# Patient Record
Sex: Female | Born: 1950
Health system: Southern US, Community
[De-identification: ages and names within clinical notes are randomized; demographics above are authoritative.]

## PROBLEM LIST (undated history)

## (undated) DIAGNOSIS — Z860100 Personal history of colon polyps, unspecified: Secondary | ICD-10-CM

## (undated) DIAGNOSIS — M25511 Pain in right shoulder: Secondary | ICD-10-CM

## (undated) DIAGNOSIS — T7840XA Allergy, unspecified, initial encounter: Secondary | ICD-10-CM

## (undated) DIAGNOSIS — F32A Depression, unspecified: Secondary | ICD-10-CM

## (undated) DIAGNOSIS — C20 Malignant neoplasm of rectum: Secondary | ICD-10-CM

## (undated) DIAGNOSIS — Z8709 Personal history of other diseases of the respiratory system: Secondary | ICD-10-CM

## (undated) DIAGNOSIS — F329 Major depressive disorder, single episode, unspecified: Secondary | ICD-10-CM

## (undated) DIAGNOSIS — R7303 Prediabetes: Secondary | ICD-10-CM

## (undated) DIAGNOSIS — K219 Gastro-esophageal reflux disease without esophagitis: Secondary | ICD-10-CM

## (undated) DIAGNOSIS — M1990 Unspecified osteoarthritis, unspecified site: Secondary | ICD-10-CM

## (undated) DIAGNOSIS — J449 Chronic obstructive pulmonary disease, unspecified: Secondary | ICD-10-CM

## (undated) DIAGNOSIS — R011 Cardiac murmur, unspecified: Secondary | ICD-10-CM

## (undated) DIAGNOSIS — F419 Anxiety disorder, unspecified: Secondary | ICD-10-CM

## (undated) DIAGNOSIS — I10 Essential (primary) hypertension: Secondary | ICD-10-CM

## (undated) DIAGNOSIS — D259 Leiomyoma of uterus, unspecified: Secondary | ICD-10-CM

## (undated) DIAGNOSIS — H269 Unspecified cataract: Secondary | ICD-10-CM

## (undated) HISTORY — DX: Gastro-esophageal reflux disease without esophagitis: K21.9

## (undated) HISTORY — DX: Chronic obstructive pulmonary disease, unspecified: J44.9

## (undated) HISTORY — DX: Major depressive disorder, single episode, unspecified: F32.9

## (undated) HISTORY — DX: Allergy, unspecified, initial encounter: T78.40XA

## (undated) HISTORY — DX: Cardiac murmur, unspecified: R01.1

## (undated) HISTORY — DX: Unspecified osteoarthritis, unspecified site: M19.90

## (undated) HISTORY — DX: Depression, unspecified: F32.A

## (undated) HISTORY — DX: Unspecified cataract: H26.9

## (undated) HISTORY — PX: COLONOSCOPY: SHX174

## (undated) HISTORY — DX: Essential (primary) hypertension: I10

## (undated) HISTORY — DX: Malignant neoplasm of rectum: C20

## (undated) HISTORY — DX: Anxiety disorder, unspecified: F41.9

---
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
## 1998-05-29 ENCOUNTER — Other Ambulatory Visit: Admission: RE | Admit: 1998-05-29 | Discharge: 1998-05-29 | Payer: Self-pay | Admitting: Obstetrics and Gynecology

## 2000-06-30 ENCOUNTER — Other Ambulatory Visit: Admission: RE | Admit: 2000-06-30 | Discharge: 2000-06-30 | Payer: Self-pay | Admitting: Obstetrics and Gynecology

## 2001-07-19 ENCOUNTER — Other Ambulatory Visit: Admission: RE | Admit: 2001-07-19 | Discharge: 2001-07-19 | Payer: Self-pay | Admitting: Obstetrics and Gynecology

## 2002-08-08 ENCOUNTER — Encounter: Admission: RE | Admit: 2002-08-08 | Discharge: 2002-08-08 | Payer: Self-pay | Admitting: Obstetrics and Gynecology

## 2002-08-08 ENCOUNTER — Encounter: Payer: Self-pay | Admitting: Obstetrics and Gynecology

## 2002-09-22 HISTORY — PX: APPENDECTOMY: SHX54

## 2002-09-22 HISTORY — PX: LOW ANTERIOR BOWEL RESECTION: SUR1240

## 2002-11-02 ENCOUNTER — Encounter: Payer: Self-pay | Admitting: Gastroenterology

## 2002-11-02 ENCOUNTER — Ambulatory Visit (HOSPITAL_COMMUNITY): Admission: RE | Admit: 2002-11-02 | Discharge: 2002-11-02 | Payer: Self-pay | Admitting: Gastroenterology

## 2002-11-28 ENCOUNTER — Encounter: Payer: Self-pay | Admitting: General Surgery

## 2002-12-01 ENCOUNTER — Inpatient Hospital Stay (HOSPITAL_COMMUNITY): Admission: RE | Admit: 2002-12-01 | Discharge: 2002-12-10 | Payer: Self-pay | Admitting: General Surgery

## 2002-12-01 ENCOUNTER — Encounter (INDEPENDENT_AMBULATORY_CARE_PROVIDER_SITE_OTHER): Payer: Self-pay | Admitting: Specialist

## 2003-01-10 ENCOUNTER — Ambulatory Visit: Admission: RE | Admit: 2003-01-10 | Discharge: 2003-03-15 | Payer: Self-pay | Admitting: Radiation Oncology

## 2003-09-23 DIAGNOSIS — D259 Leiomyoma of uterus, unspecified: Secondary | ICD-10-CM

## 2003-09-23 HISTORY — DX: Leiomyoma of uterus, unspecified: D25.9

## 2003-11-06 ENCOUNTER — Ambulatory Visit (HOSPITAL_COMMUNITY): Admission: RE | Admit: 2003-11-06 | Discharge: 2003-11-06 | Payer: Self-pay | Admitting: Oncology

## 2004-01-19 ENCOUNTER — Encounter: Admission: RE | Admit: 2004-01-19 | Discharge: 2004-01-19 | Payer: Self-pay | Admitting: General Surgery

## 2004-04-29 ENCOUNTER — Encounter: Admission: RE | Admit: 2004-04-29 | Discharge: 2004-04-29 | Payer: Self-pay | Admitting: Oncology

## 2004-05-06 ENCOUNTER — Encounter: Admission: RE | Admit: 2004-05-06 | Discharge: 2004-05-06 | Payer: Self-pay | Admitting: Oncology

## 2004-10-23 ENCOUNTER — Ambulatory Visit: Payer: Self-pay | Admitting: Gastroenterology

## 2004-10-28 ENCOUNTER — Ambulatory Visit: Payer: Self-pay | Admitting: Oncology

## 2004-11-06 ENCOUNTER — Ambulatory Visit: Payer: Self-pay | Admitting: Gastroenterology

## 2005-04-25 ENCOUNTER — Ambulatory Visit: Payer: Self-pay | Admitting: Oncology

## 2005-04-28 ENCOUNTER — Ambulatory Visit (HOSPITAL_COMMUNITY): Admission: RE | Admit: 2005-04-28 | Discharge: 2005-04-28 | Payer: Self-pay | Admitting: Oncology

## 2005-10-24 ENCOUNTER — Ambulatory Visit: Payer: Self-pay | Admitting: Oncology

## 2006-05-08 ENCOUNTER — Ambulatory Visit: Payer: Self-pay | Admitting: Oncology

## 2006-05-14 LAB — CEA: CEA: 2.7 ng/mL (ref 0.0–5.0)

## 2006-05-14 LAB — COMPREHENSIVE METABOLIC PANEL
ALT: 26 U/L (ref 0–40)
Albumin: 4.7 g/dL (ref 3.5–5.2)
CO2: 28 mEq/L (ref 19–32)
Calcium: 9.4 mg/dL (ref 8.4–10.5)
Chloride: 105 mEq/L (ref 96–112)
Glucose, Bld: 114 mg/dL — ABNORMAL HIGH (ref 70–99)
Potassium: 4.7 mEq/L (ref 3.5–5.3)
Sodium: 141 mEq/L (ref 135–145)
Total Protein: 7.4 g/dL (ref 6.0–8.3)

## 2006-05-14 LAB — CBC WITH DIFFERENTIAL/PLATELET
BASO%: 0.5 % (ref 0.0–2.0)
Eosinophils Absolute: 0.1 10*3/uL (ref 0.0–0.5)
LYMPH%: 27.4 % (ref 14.0–48.0)
MCHC: 33.2 g/dL (ref 32.0–36.0)
MONO#: 0.3 10*3/uL (ref 0.1–0.9)
NEUT#: 3.1 10*3/uL (ref 1.5–6.5)
Platelets: 223 10*3/uL (ref 145–400)
RBC: 4.7 10*6/uL (ref 3.70–5.32)
RDW: 13.2 % (ref 11.3–14.5)
WBC: 4.8 10*3/uL (ref 3.9–10.0)
lymph#: 1.3 10*3/uL (ref 0.9–3.3)

## 2006-05-14 LAB — LACTATE DEHYDROGENASE: LDH: 162 U/L (ref 94–250)

## 2006-11-09 ENCOUNTER — Ambulatory Visit: Payer: Self-pay | Admitting: Oncology

## 2006-11-12 LAB — COMPREHENSIVE METABOLIC PANEL
AST: 28 U/L (ref 0–37)
Albumin: 4.6 g/dL (ref 3.5–5.2)
Alkaline Phosphatase: 84 U/L (ref 39–117)
Chloride: 104 mEq/L (ref 96–112)
Glucose, Bld: 132 mg/dL — ABNORMAL HIGH (ref 70–99)
Potassium: 4.3 mEq/L (ref 3.5–5.3)
Sodium: 142 mEq/L (ref 135–145)
Total Protein: 7.4 g/dL (ref 6.0–8.3)

## 2006-11-12 LAB — CBC WITH DIFFERENTIAL/PLATELET
Basophils Absolute: 0 10*3/uL (ref 0.0–0.1)
EOS%: 2.1 % (ref 0.0–7.0)
HGB: 13 g/dL (ref 11.6–15.9)
MCH: 28.2 pg (ref 26.0–34.0)
MONO#: 0.3 10*3/uL (ref 0.1–0.9)
NEUT#: 3 10*3/uL (ref 1.5–6.5)
RDW: 13.2 % (ref 11.3–14.5)
WBC: 4.5 10*3/uL (ref 3.9–10.0)
lymph#: 1.1 10*3/uL (ref 0.9–3.3)

## 2006-12-02 ENCOUNTER — Encounter: Admission: RE | Admit: 2006-12-02 | Discharge: 2006-12-02 | Payer: Self-pay | Admitting: Oncology

## 2006-12-07 ENCOUNTER — Ambulatory Visit: Payer: Self-pay | Admitting: Gastroenterology

## 2006-12-23 ENCOUNTER — Encounter (INDEPENDENT_AMBULATORY_CARE_PROVIDER_SITE_OTHER): Payer: Self-pay | Admitting: *Deleted

## 2006-12-23 ENCOUNTER — Ambulatory Visit: Payer: Self-pay | Admitting: Gastroenterology

## 2007-01-15 ENCOUNTER — Inpatient Hospital Stay (HOSPITAL_COMMUNITY): Admission: EM | Admit: 2007-01-15 | Discharge: 2007-01-18 | Payer: Self-pay | Admitting: Emergency Medicine

## 2007-02-24 ENCOUNTER — Ambulatory Visit: Payer: Self-pay | Admitting: Critical Care Medicine

## 2007-03-15 ENCOUNTER — Ambulatory Visit: Payer: Self-pay | Admitting: Internal Medicine

## 2007-05-09 ENCOUNTER — Ambulatory Visit: Payer: Self-pay | Admitting: Oncology

## 2007-05-13 LAB — COMPREHENSIVE METABOLIC PANEL
ALT: 21 U/L (ref 0–35)
AST: 21 U/L (ref 0–37)
CO2: 25 mEq/L (ref 19–32)
Calcium: 9.1 mg/dL (ref 8.4–10.5)
Chloride: 106 mEq/L (ref 96–112)
Potassium: 3.9 mEq/L (ref 3.5–5.3)
Sodium: 142 mEq/L (ref 135–145)
Total Protein: 6.9 g/dL (ref 6.0–8.3)

## 2007-05-13 LAB — CBC WITH DIFFERENTIAL/PLATELET
BASO%: 0.6 % (ref 0.0–2.0)
EOS%: 1.4 % (ref 0.0–7.0)
HCT: 36.6 % (ref 34.8–46.6)
MCHC: 33.5 g/dL (ref 32.0–36.0)
MONO#: 0.3 10*3/uL (ref 0.1–0.9)
RBC: 4.35 10*6/uL (ref 3.70–5.32)
RDW: 13.6 % (ref 11.3–14.5)
WBC: 5.2 10*3/uL (ref 3.9–10.0)
lymph#: 1.3 10*3/uL (ref 0.9–3.3)

## 2007-05-13 LAB — LACTATE DEHYDROGENASE: LDH: 143 U/L (ref 94–250)

## 2007-11-10 ENCOUNTER — Ambulatory Visit: Payer: Self-pay | Admitting: Oncology

## 2007-11-15 ENCOUNTER — Ambulatory Visit (HOSPITAL_COMMUNITY): Admission: RE | Admit: 2007-11-15 | Discharge: 2007-11-15 | Payer: Self-pay | Admitting: Oncology

## 2007-11-15 LAB — CBC WITH DIFFERENTIAL/PLATELET
Eosinophils Absolute: 0.1 10*3/uL (ref 0.0–0.5)
HCT: 38.7 % (ref 34.8–46.6)
LYMPH%: 29.2 % (ref 14.0–48.0)
MCV: 84.1 fL (ref 81.0–101.0)
MONO%: 5.9 % (ref 0.0–13.0)
NEUT#: 2.9 10*3/uL (ref 1.5–6.5)
NEUT%: 62.7 % (ref 39.6–76.8)
Platelets: 214 10*3/uL (ref 145–400)
RBC: 4.61 10*6/uL (ref 3.70–5.32)

## 2007-11-15 LAB — COMPREHENSIVE METABOLIC PANEL
AST: 26 U/L (ref 0–37)
Alkaline Phosphatase: 96 U/L (ref 39–117)
BUN: 13 mg/dL (ref 6–23)
Calcium: 9.3 mg/dL (ref 8.4–10.5)
Chloride: 103 mEq/L (ref 96–112)
Creatinine, Ser: 0.68 mg/dL (ref 0.40–1.20)
Glucose, Bld: 117 mg/dL — ABNORMAL HIGH (ref 70–99)

## 2007-11-15 LAB — CEA: CEA: 1.8 ng/mL (ref 0.0–5.0)

## 2008-01-14 ENCOUNTER — Telehealth (INDEPENDENT_AMBULATORY_CARE_PROVIDER_SITE_OTHER): Payer: Self-pay | Admitting: *Deleted

## 2008-05-07 ENCOUNTER — Ambulatory Visit: Payer: Self-pay | Admitting: Oncology

## 2008-06-05 ENCOUNTER — Encounter: Payer: Self-pay | Admitting: Gastroenterology

## 2008-06-05 LAB — CBC WITH DIFFERENTIAL/PLATELET
BASO%: 0.5 % (ref 0.0–2.0)
EOS%: 1.1 % (ref 0.0–7.0)
MCH: 28.2 pg (ref 26.0–34.0)
MCHC: 33.5 g/dL (ref 32.0–36.0)
RBC: 4.57 10*6/uL (ref 3.70–5.32)
RDW: 12.8 % (ref 11.3–14.5)
lymph#: 1.5 10*3/uL (ref 0.9–3.3)

## 2008-06-05 LAB — COMPREHENSIVE METABOLIC PANEL
AST: 19 U/L (ref 0–37)
Albumin: 4.7 g/dL (ref 3.5–5.2)
Alkaline Phosphatase: 99 U/L (ref 39–117)
Potassium: 4.2 mEq/L (ref 3.5–5.3)
Sodium: 140 mEq/L (ref 135–145)
Total Bilirubin: 0.6 mg/dL (ref 0.3–1.2)
Total Protein: 7.2 g/dL (ref 6.0–8.3)

## 2008-11-08 ENCOUNTER — Encounter (INDEPENDENT_AMBULATORY_CARE_PROVIDER_SITE_OTHER): Payer: Self-pay | Admitting: *Deleted

## 2008-11-09 DIAGNOSIS — K219 Gastro-esophageal reflux disease without esophagitis: Secondary | ICD-10-CM

## 2008-11-09 DIAGNOSIS — J209 Acute bronchitis, unspecified: Secondary | ICD-10-CM | POA: Insufficient documentation

## 2008-11-10 ENCOUNTER — Ambulatory Visit: Payer: Self-pay | Admitting: Critical Care Medicine

## 2008-11-10 ENCOUNTER — Encounter: Payer: Self-pay | Admitting: Adult Health

## 2008-11-30 ENCOUNTER — Ambulatory Visit: Payer: Self-pay | Admitting: Oncology

## 2008-12-05 ENCOUNTER — Encounter: Payer: Self-pay | Admitting: Gastroenterology

## 2008-12-05 LAB — CBC WITH DIFFERENTIAL/PLATELET
Basophils Absolute: 0.1 10*3/uL (ref 0.0–0.1)
EOS%: 2.4 % (ref 0.0–7.0)
HCT: 39 % (ref 34.8–46.6)
HGB: 12.8 g/dL (ref 11.6–15.9)
LYMPH%: 22.3 % (ref 14.0–49.7)
MCH: 27.7 pg (ref 25.1–34.0)
MCV: 84.8 fL (ref 79.5–101.0)
MONO%: 5.6 % (ref 0.0–14.0)
NEUT%: 67.8 % (ref 38.4–76.8)
Platelets: 243 10*3/uL (ref 145–400)
RDW: 13.4 % (ref 11.2–14.5)

## 2008-12-05 LAB — COMPREHENSIVE METABOLIC PANEL
AST: 20 U/L (ref 0–37)
Albumin: 4.5 g/dL (ref 3.5–5.2)
Alkaline Phosphatase: 99 U/L (ref 39–117)
BUN: 15 mg/dL (ref 6–23)
Calcium: 9.3 mg/dL (ref 8.4–10.5)
Creatinine, Ser: 0.72 mg/dL (ref 0.40–1.20)
Glucose, Bld: 114 mg/dL — ABNORMAL HIGH (ref 70–99)
Total Bilirubin: 0.4 mg/dL (ref 0.3–1.2)

## 2008-12-05 LAB — LACTATE DEHYDROGENASE: LDH: 192 U/L (ref 94–250)

## 2008-12-05 LAB — CEA: CEA: 2.7 ng/mL (ref 0.0–5.0)

## 2008-12-15 ENCOUNTER — Encounter: Payer: Self-pay | Admitting: Gastroenterology

## 2008-12-18 ENCOUNTER — Ambulatory Visit (HOSPITAL_COMMUNITY): Admission: RE | Admit: 2008-12-18 | Discharge: 2008-12-18 | Payer: Self-pay | Admitting: Oncology

## 2008-12-19 ENCOUNTER — Ambulatory Visit: Payer: Self-pay | Admitting: Gastroenterology

## 2008-12-29 ENCOUNTER — Ambulatory Visit: Payer: Self-pay | Admitting: Gastroenterology

## 2009-06-12 ENCOUNTER — Ambulatory Visit: Payer: Self-pay | Admitting: Oncology

## 2009-06-14 ENCOUNTER — Encounter: Payer: Self-pay | Admitting: Gastroenterology

## 2009-06-14 LAB — CBC WITH DIFFERENTIAL/PLATELET
BASO%: 0.6 % (ref 0.0–2.0)
EOS%: 1.5 % (ref 0.0–7.0)
LYMPH%: 22.9 % (ref 14.0–49.7)
MCHC: 33.2 g/dL (ref 31.5–36.0)
MONO#: 0.4 10*3/uL (ref 0.1–0.9)
Platelets: 218 10*3/uL (ref 145–400)
RBC: 4.47 10*6/uL (ref 3.70–5.45)
WBC: 5.9 10*3/uL (ref 3.9–10.3)
lymph#: 1.3 10*3/uL (ref 0.9–3.3)

## 2009-06-14 LAB — COMPREHENSIVE METABOLIC PANEL
ALT: 23 U/L (ref 0–35)
AST: 28 U/L (ref 0–37)
CO2: 28 mEq/L (ref 19–32)
Sodium: 141 mEq/L (ref 135–145)
Total Bilirubin: 0.7 mg/dL (ref 0.3–1.2)
Total Protein: 7.4 g/dL (ref 6.0–8.3)

## 2009-06-14 LAB — LACTATE DEHYDROGENASE: LDH: 145 U/L (ref 94–250)

## 2009-10-17 ENCOUNTER — Ambulatory Visit: Payer: Self-pay | Admitting: Critical Care Medicine

## 2009-10-17 ENCOUNTER — Inpatient Hospital Stay (HOSPITAL_COMMUNITY): Admission: AD | Admit: 2009-10-17 | Discharge: 2009-10-22 | Payer: Self-pay | Admitting: Critical Care Medicine

## 2009-10-17 DIAGNOSIS — I10 Essential (primary) hypertension: Secondary | ICD-10-CM | POA: Insufficient documentation

## 2009-11-02 ENCOUNTER — Ambulatory Visit: Payer: Self-pay | Admitting: Pulmonary Disease

## 2009-11-02 DIAGNOSIS — J45909 Unspecified asthma, uncomplicated: Secondary | ICD-10-CM | POA: Insufficient documentation

## 2009-12-07 ENCOUNTER — Ambulatory Visit: Payer: Self-pay | Admitting: Oncology

## 2009-12-11 ENCOUNTER — Encounter: Payer: Self-pay | Admitting: Gastroenterology

## 2009-12-11 LAB — LACTATE DEHYDROGENASE: LDH: 167 U/L (ref 94–250)

## 2009-12-11 LAB — CBC WITH DIFFERENTIAL/PLATELET
Eosinophils Absolute: 0.1 10*3/uL (ref 0.0–0.5)
MONO#: 0.3 10*3/uL (ref 0.1–0.9)
MONO%: 4.6 % (ref 0.0–14.0)
NEUT#: 3.8 10*3/uL (ref 1.5–6.5)
RBC: 4.44 10*6/uL (ref 3.70–5.45)
RDW: 14.1 % (ref 11.2–14.5)
WBC: 5.7 10*3/uL (ref 3.9–10.3)

## 2009-12-11 LAB — COMPREHENSIVE METABOLIC PANEL
ALT: 22 U/L (ref 0–35)
AST: 25 U/L (ref 0–37)
CO2: 31 mEq/L (ref 19–32)
Chloride: 108 mEq/L (ref 96–112)
Sodium: 143 mEq/L (ref 135–145)
Total Bilirubin: 0.6 mg/dL (ref 0.3–1.2)
Total Protein: 7.5 g/dL (ref 6.0–8.3)

## 2009-12-11 LAB — CEA: CEA: 4.3 ng/mL (ref 0.0–5.0)

## 2009-12-17 ENCOUNTER — Telehealth (INDEPENDENT_AMBULATORY_CARE_PROVIDER_SITE_OTHER): Payer: Self-pay | Admitting: *Deleted

## 2010-01-17 ENCOUNTER — Telehealth (INDEPENDENT_AMBULATORY_CARE_PROVIDER_SITE_OTHER): Payer: Self-pay | Admitting: *Deleted

## 2010-02-11 ENCOUNTER — Ambulatory Visit: Payer: Self-pay | Admitting: Oncology

## 2010-02-19 ENCOUNTER — Telehealth (INDEPENDENT_AMBULATORY_CARE_PROVIDER_SITE_OTHER): Payer: Self-pay | Admitting: *Deleted

## 2010-03-13 ENCOUNTER — Telehealth: Payer: Self-pay | Admitting: Pulmonary Disease

## 2010-03-21 ENCOUNTER — Telehealth: Payer: Self-pay | Admitting: Pulmonary Disease

## 2010-06-11 ENCOUNTER — Ambulatory Visit: Payer: Self-pay | Admitting: Oncology

## 2010-06-14 ENCOUNTER — Encounter: Payer: Self-pay | Admitting: Gastroenterology

## 2010-06-14 LAB — COMPREHENSIVE METABOLIC PANEL
CO2: 32 mEq/L (ref 19–32)
Creatinine, Ser: 0.7 mg/dL (ref 0.40–1.20)
Glucose, Bld: 76 mg/dL (ref 70–99)
Total Bilirubin: 0.9 mg/dL (ref 0.3–1.2)

## 2010-06-14 LAB — CBC WITH DIFFERENTIAL/PLATELET
Eosinophils Absolute: 0.1 10*3/uL (ref 0.0–0.5)
HCT: 39.4 % (ref 34.8–46.6)
LYMPH%: 31.4 % (ref 14.0–49.7)
MCHC: 32.1 g/dL (ref 31.5–36.0)
MONO#: 0.5 10*3/uL (ref 0.1–0.9)
NEUT%: 58.6 % (ref 38.4–76.8)
Platelets: 226 10*3/uL (ref 145–400)
WBC: 5.6 10*3/uL (ref 3.9–10.3)

## 2010-06-14 LAB — CEA: CEA: 2.3 ng/mL (ref 0.0–5.0)

## 2010-06-14 LAB — LACTATE DEHYDROGENASE: LDH: 143 U/L (ref 94–250)

## 2010-08-21 ENCOUNTER — Encounter: Admission: RE | Admit: 2010-08-21 | Discharge: 2010-08-21 | Payer: Self-pay | Admitting: Oncology

## 2010-09-02 ENCOUNTER — Telehealth: Payer: Self-pay | Admitting: Pulmonary Disease

## 2010-09-11 ENCOUNTER — Ambulatory Visit: Payer: Self-pay | Admitting: Pulmonary Disease

## 2010-09-12 ENCOUNTER — Telehealth: Payer: Self-pay | Admitting: Pulmonary Disease

## 2010-10-02 ENCOUNTER — Ambulatory Visit: Admit: 2010-10-02 | Payer: Self-pay | Admitting: Pulmonary Disease

## 2010-10-03 ENCOUNTER — Telehealth: Payer: Self-pay | Admitting: Pulmonary Disease

## 2010-10-12 ENCOUNTER — Encounter: Payer: Self-pay | Admitting: Obstetrics and Gynecology

## 2010-10-13 ENCOUNTER — Encounter (HOSPITAL_COMMUNITY): Payer: Self-pay | Admitting: Oncology

## 2010-10-22 NOTE — Letter (Signed)
Summary: [employer]  [address]   [place], [place] [address]   Phone: [phone]  Fax: [phone]    Today's Date: November 02, 2009  Name of Patient: Jane [name]  The above named patient had a medical visit today at:  2:15 pm.  Please take this into consideration when reviewing the time away from work.    Special Instructions:  [  ] None  [  ] To be off the remainder of today, returning to the normal work / school schedule tomorrow.  [  ] To be off until the next scheduled appointment on ______________________.  [x ] Other ______The above named patient is released to return to work on 11/05/2009 if you have any questions or concerns please give [place] a call                                          Sincerely yours,    Dr. Coralyn Helling

## 2010-10-22 NOTE — Progress Notes (Signed)
Summary: nos appt  Phone Note Call from Patient   Caller: [email or web address]  Call For: Jane [name] Summary of Call: ATC x2 to rsc nos from 6/29, mailbox full. Initial call taken by: Darletta Moll,  March 21, 2010 3:44 PM

## 2010-10-22 NOTE — Letter (Signed)
[facility]   Imported By: Lennie Odor 01/02/2010 15:17:08  _____________________________________________________________________  External Attachment:    Type:   Image     Comment:   External Document

## 2010-10-22 NOTE — Progress Notes (Signed)
Summary: WORK NOTE  Phone Note Call from Patient Call back at Home Phone [phone]   Caller: Patient Call For: SOOD Summary of Call: PT IS WANTING A NOTE FOR WORK DUE TO THEM PAINTING INSIDE THE BUILDING DUE TO THE FUMES AND IT AFFECTING HER ASTHMA  Initial call taken by: Lacinda Axon,  Feb 19, 2010 12:04 PM  Follow-up for Phone Call        [facility]. Carron Curie CMA  Feb 19, 2010 12:13 PM  Spoke with pt and she states her building was painted end of last week adn they used industrial paint and the fumes were very strong and it caused her to have asthma problems and she was not able to go to work on Friday. Pt states the building was aired out over teh weekend and the fumes are gone and she is better. She is requesting a work excuse note for Friday May 27,2011.  Pt request note be faxed to her job, her cell phone lost reception so I did not get fax number [facility] and get #. Please advise. Carron Curie CMA  Feb 19, 2010 12:18 PM   pt says fax # is [phone].  She wants you to call her before you fax as she may be able to come by and pick up. Follow-up by: Eugene Gavia,  Feb 19, 2010 1:07 PM  Additional Follow-up for Phone Call Additional follow up Details #1::        Will forward message to VS to address if ok to give pt a work note.  Please advise.  Thanks.  Arman Filter LPN  Feb 19, 2010 2:04 PM     Additional Follow-up for Phone Call Additional follow up Details #2::    Please advise the patient that we can not provide a work note if she was not evaluated by our practice at the time of her illness.  She may need to discuss with her human resource department to determine if her workplace has a policy to address this type of situation. Follow-up by: Coralyn Helling MD,  Feb 19, 2010 3:02 PM   Appended Document: WORK NOTE pt states she just needs a note stating her diagnosis of asthma and that is all her human resource dept needs, she states it does not have to be an out of  work note for Friday. Just a note that staets she has asthma. Please advise.   Appended Document: WORK NOTE I have dictated a letter, and will have triage/my nurse notify the patient when letter has been transcribed.

## 2010-10-22 NOTE — Progress Notes (Signed)
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 pt requests sample of advair (broke-in-between paychecks). [phone] Initial call taken by: Tivis Ringer, CNA,  March 13, 2010 2:14 PM  Follow-up for Phone Call        sample of the adviar 500/50 has been left up front for her.  she did schedule an appt on wednesday  6-29 at 12 to see VS for this problem.  she will come by to pick up the sample and the note written by VS Randell Loop CMA  March 13, 2010 3:08 PM

## 2010-10-22 NOTE — Progress Notes (Signed)
Summary: rash  Phone Note Call from Patient Call back at Work Phone [phone]   Caller: Patient Call For: sood Summary of Call: pt states that she has a rash on the inside of her hands as well as on her back, and behind knees x 1 1/2 wks. thinks this is due to the "increase" of usage of either spiriva or advair. (says dr sood increased these at [facility] 2/11. rash is "little bumps" not oozing or anything but very itchy. pt hasn't used OTC for relief. please advise. work #. note: pt also wants to know if soiriva "weakens the immune system". Initial call taken by: Tivis Ringer, CNA,  December 17, 2009 9:39 AM  Follow-up for Phone Call        Pt c/o while she was inthe hospital and using spiriva she developed a rash on most of her body. She states the rash has come back and is worse since she has continued spiriva and started advair 500-50. Pt states the rash is on her hands, back, and back of her knees. She states it is rashed red bumps and it itches. She has decreased use of advair to once daily, and states she does not use spiriva on reg basis, but still has rash.  She also wants to know if spiriva could be effecting her immune system. Pelase advise. Carron Curie CMA  December 17, 2009 10:32 AM   Additional Follow-up for Phone Call Additional follow up Details #1::        Plan was to stop spiriva anyway.  Have her stop spiriva, and monitor rash.  If rash persists, then have her stop advair also.  She is to continue with her nebulizer treatment as previously prescribed.  Please have her call again if her breathing gets worse after stopping her inhalers. Additional Follow-up by: Coralyn Helling MD,  December 17, 2009 1:29 PM

## 2010-10-22 NOTE — Assessment & Plan Note (Signed)
Summary: hfu/jd   Primary Provider/Referring Provider:  Leonides Sake  CC:  HFU.  Asthma.  The patient states her breathing has improved on Spiriva. She is no longer using the Advair and says she stopped taking Melatonin at night  because it did not help.Jane [name]  History of Present Illness: 60  year old  female who has had chronic cough , and asthmatic bronchitis  She has been doing well since hospital discharge.  She is not having much cough, wheeze, or sputum.  She is not using advair.  Some one in the hospital told her she did not need to use advair with prednisone.  She is using her xopenex two times a day.  She continues with spiriva.   Current Medications (verified): 1)  Multivitamins  Tabs (Multiple Vitamin) .... Take 1 Tablet By Mouth Once A Day 2)  Paxil 30 Mg Tabs (Paroxetine Hcl) .... Take 1 Tablet By Mouth Once A Day 3)  Amlodipine Besylate 10 Mg Tabs (Amlodipine Besylate) .... Take 1 Tablet By Mouth Once A Day 4)  Xopenex Hfa 45 Mcg/act Aero (Levalbuterol Tartrate) .... Inhale 2 Puffs Every Four Hours As Needed 5)  Spiriva Handihaler 18 Mcg Caps (Tiotropium Bromide Monohydrate) .Jane [name].. 1 Capsule in Handihaler Daily  Allergies (verified): 1)  ! Morphine  Past History:  Past Medical History: colon cancer 2004- Dr. Craige Cotta , resection 2004, last colon 2010 -no reoccurence Asthma Hypertension Depression GERD  Past Surgical History: Reviewed history from 10/17/2009 and no changes required. colon cancer 2004-s/p resection  Family History: Reviewed history from 10/17/2009 and no changes required. pos for colon ca-aunt  mother-? coronary artery disease grandmother -myeloma  Social History: Reviewed history from 10/17/2009 and no changes required. Divorced 2 kids- adult  Never smoker Rare alcohol no drug hx   Vital Signs:  Patient profile:   60 year old female Height:      62 inches (157.48 cm) Weight:      196.50 pounds (89.32 kg) BMI:     36.07 O2 Sat:      97 % on  Room air Temp:     98.9 degrees F (37.17 degrees C) oral Pulse rate:   128 / minute BP sitting:   130 / 70  (left arm) Cuff size:   regular  Vitals Entered By: Michel Bickers CMA (November 02, 2009 2:36 PM)  O2 Sat at Rest %:  97 O2 Flow:  Room air  Physical Exam  General:  healthy appearing and obese.   Nose:  no deformity, discharge, inflammation, or lesions Mouth:  no deformity or lesions Neck:  no JVD.   Lungs:  clear bilaterally to auscultation and percussion Heart:  regular rate and rhythm, S1, S2 without murmurs, rubs, gallops, or clicks Abdomen:  bowel sounds positive; abdomen soft and non-tender without masses, or organomegaly Extremities:  no clubbing, cyanosis, edema, or deformity noted Cervical Nodes:  no significant adenopathy   Impression & Recommendations:  Problem # 1:  ASTHMA (ICD-493.90) She has been doing well since hospital discharge.  She was confused about whether she needs to continue advair.  I will restart her advair.  If she is stable after several weeks, then I advised that she can stop the spiriva and monitor her symptoms.  Her PFT's in hospital showed severe obstruction, but this was done during an exacerbation.  Will repeat spirometry at next follow up to see if she has improvement in her numbers.  Medications Added to Medication List This Visit: 1)  Spiriva  Handihaler 18 Mcg Caps (Tiotropium bromide monohydrate) .Jane [name].. 1 capsule in handihaler daily 2)  Advair Diskus 500-50 Mcg/dose Aepb (Fluticasone-salmeterol) .... One puff two times a day  Complete Medication List: 1)  Multivitamins Tabs (Multiple vitamin) .... Take 1 tablet by mouth once a day 2)  Paxil 30 Mg Tabs (Paroxetine hcl) .... Take 1 tablet by mouth once a day 3)  Amlodipine Besylate 10 Mg Tabs (Amlodipine besylate) .... Take 1 tablet by mouth once a day 4)  Xopenex Hfa 45 Mcg/act Aero (Levalbuterol tartrate) .... Inhale 2 puffs every four hours as needed 5)  Spiriva Handihaler 18 Mcg Caps  (Tiotropium bromide monohydrate) .Jane [name].. 1 capsule in handihaler daily 6)  Advair Diskus 500-50 Mcg/dose Aepb (Fluticasone-salmeterol) .... One puff two times a day  Other Orders: Est. Patient Level III ([address])  Patient Instructions: 1)  Advair one puff two times a day 2)  Continue spiriva for next 3 or 4 weeks.  If breathing is doing well, then stop spiriva and continue advair. 3)  Continue xopenex two puffs up to four times per day as needed for cough, wheeze, congestion, or shortness of breath 4)  Follow up in 3 months Prescriptions: ADVAIR DISKUS 500-50 MCG/DOSE AEPB (FLUTICASONE-SALMETEROL) one puff two times a day  #1 x 6   Entered and Authorized by:   Coralyn Helling MD   Signed by:   Coralyn Helling MD on 11/02/2009   Method used:   Electronically to        [facility] [phone]* (retail)       [address]       [place], [place]  [address]       Ph: [phone] or [phone]       Fax: [phone]   RxID:   [id number]

## 2010-10-22 NOTE — Progress Notes (Signed)
Summary: breathing problem  Phone Note Call from Patient Call back at [phone]   Caller: Patient Call For: sood Summary of Call: pt still have breathing problem after stopping spiriva and advair Initial call taken by: Rickard Patience,  January 17, 2010 1:48 PM  Follow-up for Phone Call        Pt states she has stopped spiriva, but has continued advair one puff at night because she is afraid her breathing will get worse if she stops. She states she is still itching, but this is controlled by benadryl. SHe wants to [facility] if it is ok to take benadryl daily? She also states she read that advair can effect your wbc count and she wanted to know if this was true? Please advise.Carron Curie CMA  January 17, 2010 2:22 PM   Additional Follow-up for Phone Call Additional follow up Details #1::        Explain to her that elevated WBC level is more common with systemic steroid use, such as prednisone, and not as much of an issue with inhaled steroids.  She can continue advair, and as needed xopenex.  She would need to f/u with her primary physician to better determine what is causing her itching, since this may not be related to her inhaler use. Additional Follow-up by: Coralyn Helling MD,  January 17, 2010 3:11 PM     Appended Document: breathing problem pt advised.

## 2010-10-22 NOTE — Assessment & Plan Note (Signed)
Summary: HOSPITAL ADMISSION   Primary Provider/Referring Provider:  Leonides Sake  CC:  pt walked in: increased SOB, chest tightness, and prod cough with clear  mucus x2days.  History of Present Illness: HOSPITAL ADMISSION  60  year old  female who has had chronic cough , and asthmatic bronchitis  October 17, 2009--Last seen 10/2008. Presents for an acute office visit. pt walked in: Complains over last 1 week she has been very sick with cough, congesiton, increased SOB. Has not slept due to cough. She  feels terrible weak. She is in wheelchair due to feels lightheaded walking. She has been wheezing so bad it is hard to catch breath. She has had decreased appetite. On arrival she had marginal O2 sats 90-92%-we verified few times (2/2 artificial nails). HR is 140s. She denies chest pain. Has used xopenex HFA 1x this am 1 hr prior to arrrival at 8am. Denies chest pain, orthopnea, hemoptysis,  n/v/d, edema, headache,recent travel or antibiotics.   Medications Prior to Update: 1)  Advair Diskus 250-50 Mcg/dose  Misc (Fluticasone-Salmeterol) .... Inhale 1 Puff Two Times A Day, Rinse Mouth Well After Use. 2)  Multivitamins  Tabs (Multiple Vitamin) .... Take 1 Tablet By Mouth Once A Day 3)  Paxil 30 Mg Tabs (Paroxetine Hcl) .... Take 1 Tablet By Mouth Once A Day 4)  Amlodipine Besylate 10 Mg Tabs (Amlodipine Besylate) .... Take 1 Tablet By Mouth Once A Day 5)  Xopenex Hfa 45 Mcg/act Aero (Levalbuterol Tartrate) .... Inhale 2 Puffs Every Four Hours As Needed 6)  Augmentin 875-125 Mg Tabs (Amoxicillin-Pot Clavulanate) .Marland Kitchen.. 1 By Mouth Two Times A Day 7)  Tessalon 200 Mg Caps (Benzonatate) .Marland Kitchen.. 1 By Mouth Three Times A Day As Needed 8)  Promethazine-Codeine 6.25-10 Mg/51ml Syrp (Promethazine-Codeine) .Marland Kitchen.. 1-2 Tsp Every 4-6 Hr As Needed Cough 9)  Prednisone 10 Mg Tabs (Prednisone) .... 4 Tabs For 2 Days, Then 3 Tabs For 2 Days, 2 Tabs For 2 Days, Then 1 Tab For 2 Days, Then Stop 10)  Moviprep 100 Gm  Solr  (Peg-Kcl-Nacl-Nasulf-Na Asc-C) .... Take As Directed With Prep Instructions  Current Medications (verified): 1)  Advair Diskus 250-50 Mcg/dose  Misc (Fluticasone-Salmeterol) .... Inhale 1 Puff Two Times A Day, Rinse Mouth Well After Use. 2)  Multivitamins  Tabs (Multiple Vitamin) .... Take 1 Tablet By Mouth Once A Day 3)  Paxil 30 Mg Tabs (Paroxetine Hcl) .... Take 1 Tablet By Mouth Once A Day 4)  Amlodipine Besylate 10 Mg Tabs (Amlodipine Besylate) .... Take 1 Tablet By Mouth Once A Day 5)  Xopenex Hfa 45 Mcg/act Aero (Levalbuterol Tartrate) .... Inhale 2 Puffs Every Four Hours As Needed  Allergies (verified): 1)  ! Morphine  Past History:  Family History: Last updated: 10/17/2009 pos for colon ca-aunt  mother-? coronary artery disease grandmother -myeloma  Social History: Last updated: 10/17/2009 Divorced 2 kids- adult  Never smoker Rare alcohol no drug hx   Risk Factors: Smoking Status: never (11/09/2008)  Past Medical History: EsoPHAGEAL REFLUX (ICD-530.81) ASTHMATIC BRONCHITIS, ACUTE (ICD-466.0) colon cancer 2004- Dr. Craige Cotta , resection 2004, last colon 2010 -no reoccurence HTN    Past Surgical History: colon cancer 2004-s/p resection  Family History: pos for colon ca-aunt  mother-? coronary artery disease grandmother -myeloma  Social History: Divorced 2 kids- adult  Never smoker Rare alcohol no drug hx   Review of Systems      See HPI  Vital Signs:  Patient profile:   60 year old female Height:  62 inches Weight:      200.25 pounds BMI:     36.76 O2 Sat:      98 % on Room air Temp:     100.2 degrees F oral Pulse rate:   138 / minute BP sitting:   158 / 84  (right arm) Cuff size:   large  Vitals Entered By: Boone Master CNA (October 17, 2009 9:02 AM)  O2 Flow:  Room air CC: pt walked in: increased SOB, chest tightness, prod cough with clear  mucus x2days Is Patient Diabetic? No Comments Medications reviewed with patient Daytime  contact number verified with patient. Boone Master CNA  October 17, 2009 9:03 AM    Physical Exam  Additional Exam:  GEN: A/Ox3; appears weak in wheelchair.  HEENT:  Melvin/AT, , EACs-clear, TMs-wnl, NOSE-clear, THROAT-clear NECK:  Supple w/ fair ROM; no JVD; normal carotid impulses w/o bruits; no thyromegaly or nodules palpated; no lymphadenopathy. CHEST: Coarse BS w/ exp wheezing., Mild accessory use.  HEART:  RRR, no m/r/g  , ST HR 140 ABDOMEN:  Soft & nt; nml bowel sounds; no organomegaly or masses detected. EXT: Warm bil,  no calf pain, edema, clubbing, pulses intact Neuro: intact w/ no focal deficits noted.    Impression & Recommendations:  Problem # 1:  ASTHMATIC BRONCHITIS, ACUTE (ICD-466.0)  Exacerbation w/ associated tacycardia -possible secondary to xopenex use will check labs w/ tsh and cardiac enzymes, EKG Will admit begin pulmonary hygiene w/ nebs, IV steroids, empiric abx w/ Avelox.      Orders: No Charge Patient Arrived (NCPA0) (NCPA0)  Problem # 2:  HYPERTENSION (ICD-401.9) cont on amolodipine.  monitor closely, may need to add bisoprolol for control and ST (would avoid non specifice beta blocker).  Her updated medication list for this problem includes:    Amlodipine Besylate 10 Mg Tabs (Amlodipine besylate) .Marland Kitchen... Take 1 tablet by mouth once a day  Complete Medication List: 1)  Advair Diskus 250-50 Mcg/dose Misc (Fluticasone-salmeterol) .... Inhale 1 puff two times a day, rinse mouth well after use. 2)  Multivitamins Tabs (Multiple vitamin) .... Take 1 tablet by mouth once a day 3)  Paxil 30 Mg Tabs (Paroxetine hcl) .... Take 1 tablet by mouth once a day 4)  Amlodipine Besylate 10 Mg Tabs (Amlodipine besylate) .... Take 1 tablet by mouth once a day 5)  Xopenex Hfa 45 Mcg/act Aero (Levalbuterol tartrate) .... Inhale 2 puffs every four hours as needed  Patient Instructions: 1)  ADMIT TO HOSPITAL   Appended Document: neb documentation     Clinical Lists  Changes  Orders: Added new Service order of Nebulizer Tx ([address]) - Signed

## 2010-10-22 NOTE — Letter (Signed)
Summary: [facility]  [facility]   Imported By: Sherian Rein 07/02/2010 10:52:43  _____________________________________________________________________  External Attachment:    Type:   Image     Comment:   External Document

## 2010-10-24 NOTE — Progress Notes (Signed)
Summary: sample  Phone Note Call from Patient Call back at Home Phone [phone]   Caller: Patient Call For: sood Reason for Call: Talk to Nurse Summary of Call: Patient requesting sample of advair. Initial call taken by: Lehman Prom,  September 02, 2010 9:19 AM  Follow-up for Phone Call        Pt verified Advair 500/50, one sample left at front desk, follow up scheduled for 09/11/10 @ 4:15pm Zackery Barefoot CMA  September 02, 2010 10:51 AM

## 2010-10-24 NOTE — Progress Notes (Signed)
Summary: nos appt  Phone Note Call from Patient   Caller: [email or web address]  Call For: sood Summary of Call: Rsc nos from 12/21 to 1/11. Initial call taken by: Darletta Moll,  September 12, 2010 9:27 AM

## 2010-10-24 NOTE — Progress Notes (Signed)
Summary: nos appt  Phone Note Call from Patient   Caller: [email or web address]  Call For: Stephine Langbehn Summary of Call: LMTCB x2 to rsc nos from 1/11. Initial call taken by: Darletta Moll,  October 03, 2010 3:41 PM

## 2010-12-09 LAB — CBC
HCT: 37.5 % (ref 36.0–46.0)
HCT: 39 % (ref 36.0–46.0)
Hemoglobin: 13.2 g/dL (ref 12.0–15.0)
MCV: 87 fL (ref 78.0–100.0)
Platelets: 216 10*3/uL (ref 150–400)
Platelets: 220 10*3/uL (ref 150–400)
RBC: 4.58 MIL/uL (ref 3.87–5.11)
RDW: 13.4 % (ref 11.5–15.5)
RDW: 13.4 % (ref 11.5–15.5)
WBC: 10.9 10*3/uL — ABNORMAL HIGH (ref 4.0–10.5)

## 2010-12-09 LAB — URINALYSIS, MICROSCOPIC ONLY
Ketones, ur: NEGATIVE mg/dL
Leukocytes, UA: NEGATIVE
Nitrite: NEGATIVE
pH: 5.5 (ref 5.0–8.0)

## 2010-12-09 LAB — DIFFERENTIAL
Basophils Absolute: 0 10*3/uL (ref 0.0–0.1)
Basophils Relative: 0 % (ref 0–1)
Eosinophils Absolute: 0.1 10*3/uL (ref 0.0–0.7)
Neutro Abs: 11.1 10*3/uL — ABNORMAL HIGH (ref 1.7–7.7)
Neutrophils Relative %: 92 % — ABNORMAL HIGH (ref 43–77)

## 2010-12-09 LAB — CARDIAC PANEL(CRET KIN+CKTOT+MB+TROPI)
CK, MB: 5.3 ng/mL — ABNORMAL HIGH (ref 0.3–4.0)
Relative Index: 1.1 (ref 0.0–2.5)
Relative Index: 1.4 (ref 0.0–2.5)
Total CK: 391 U/L — ABNORMAL HIGH (ref 7–177)
Total CK: 394 U/L — ABNORMAL HIGH (ref 7–177)
Troponin I: 0.03 ng/mL (ref 0.00–0.06)

## 2010-12-09 LAB — COMPREHENSIVE METABOLIC PANEL
ALT: 23 U/L (ref 0–35)
Alkaline Phosphatase: 96 U/L (ref 39–117)
BUN: 14 mg/dL (ref 6–23)
CO2: 26 mEq/L (ref 19–32)
Chloride: 101 mEq/L (ref 96–112)
GFR calc non Af Amer: >60 mL/min (ref >60)
Glucose, Bld: 120 mg/dL — ABNORMAL HIGH (ref 70–99)
Potassium: 4.8 mEq/L (ref 3.5–5.1)
Total Bilirubin: 1.4 mg/dL — ABNORMAL HIGH (ref 0.3–1.2)
Total Protein: 7.5 g/dL (ref 6.0–8.3)

## 2010-12-09 LAB — BASIC METABOLIC PANEL
BUN: 14 mg/dL (ref 6–23)
BUN: 21 mg/dL (ref 6–23)
Chloride: 100 mEq/L (ref 96–112)
Creatinine, Ser: 0.78 mg/dL (ref 0.4–1.2)
GFR calc non Af Amer: 59 mL/min — ABNORMAL LOW (ref >60)
GFR calc non Af Amer: >60 mL/min (ref >60)
Glucose, Bld: 272 mg/dL — ABNORMAL HIGH (ref 70–99)
Potassium: 3.6 mEq/L (ref 3.5–5.1)

## 2010-12-09 LAB — BRAIN NATRIURETIC PEPTIDE
Pro B Natriuretic peptide (BNP): 34 pg/mL (ref 0.0–100.0)
Pro B Natriuretic peptide (BNP): <30 pg/mL (ref 0.0–100.0)

## 2010-12-09 LAB — BLOOD GAS, ARTERIAL
Acid-base deficit: 0.4 mmol/L (ref 0.0–2.0)
Bicarbonate: 24 mEq/L (ref 20.0–24.0)
O2 Saturation: 96.8 %
TCO2: 25.3 mmol/L (ref 0–100)
pO2, Arterial: 89.7 mmHg (ref 80.0–100.0)

## 2010-12-09 LAB — MAGNESIUM: Magnesium: 2.1 mg/dL (ref 1.5–2.5)

## 2010-12-09 LAB — URINE CULTURE

## 2010-12-20 ENCOUNTER — Telehealth: Payer: Self-pay | Admitting: Pulmonary Disease

## 2010-12-20 ENCOUNTER — Other Ambulatory Visit: Payer: Self-pay | Admitting: Pulmonary Disease

## 2010-12-20 MED ORDER — LEVALBUTEROL TARTRATE 45 MCG/ACT IN AERO: 2.0000 | INHALATION_SPRAY | RESPIRATORY_TRACT | Status: DC | PRN

## 2010-12-20 MED ORDER — FLUTICASONE-SALMETEROL 500-50 MCG/DOSE IN AEPB: 1.0000 | INHALATION_SPRAY | Freq: Two times a day (BID) | RESPIRATORY_TRACT | Status: DC

## 2010-12-20 NOTE — Telephone Encounter (Signed)
Pt last seen 11-02-09 and has no pending appts.  LMOMTCB.

## 2010-12-20 NOTE — Telephone Encounter (Signed)
Pt is scheduled for follow-up on 01/14/2011 w/ VS. I have left 2 Adair 500/50 samples for her to pick up and sent RX for 1 month of Advair and 1 Xopenex HFA inhaler until OV. Pt aware she must keep OV for any further refill.

## 2010-12-20 NOTE — Telephone Encounter (Signed)
See phone note 12/20/10 lori sent rx until pt ov nxt month

## 2011-01-10 ENCOUNTER — Encounter: Payer: Self-pay | Admitting: Pulmonary Disease

## 2011-01-14 ENCOUNTER — Encounter: Payer: Self-pay | Admitting: Pulmonary Disease

## 2011-01-14 ENCOUNTER — Ambulatory Visit (INDEPENDENT_AMBULATORY_CARE_PROVIDER_SITE_OTHER): Payer: BC Managed Care – PPO | Admitting: Pulmonary Disease

## 2011-01-14 VITALS — BP 124/78 | HR 99 | Temp 98.2°F | Ht 62.0 in | Wt 189.2 lb

## 2011-01-14 DIAGNOSIS — J309 Allergic rhinitis, unspecified: Secondary | ICD-10-CM | POA: Insufficient documentation

## 2011-01-14 DIAGNOSIS — J45909 Unspecified asthma, uncomplicated: Secondary | ICD-10-CM

## 2011-01-14 MED ORDER — LEVALBUTEROL TARTRATE 45 MCG/ACT IN AERO: INHALATION_SPRAY | RESPIRATORY_TRACT | Status: DC

## 2011-01-14 MED ORDER — FLUTICASONE-SALMETEROL 250-50 MCG/DOSE IN AEPB: INHALATION_SPRAY | RESPIRATORY_TRACT | Status: DC

## 2011-01-14 MED ORDER — CETIRIZINE HCL 10 MG PO TABS: ORAL_TABLET | ORAL | Status: DC

## 2011-01-14 NOTE — Patient Instructions (Signed)
Advair 250/50 one puff twice per day, and rinse mouth after using Xopenex two puffs as needed for cough, wheeze, chest congestion, or shortness of breath Zyrtec or equivalent 10 mg daily as needed for allergies Follow up in 6 months

## 2011-01-14 NOTE — Assessment & Plan Note (Signed)
She can use zyrtec or equivalent as needed.

## 2011-01-14 NOTE — Progress Notes (Signed)
  Subjective:    Patient ID: Jane [name], female    DOB: 01-Dec-1950, 60 y.o.   MRN: [id number]  HPI 60 year old female who has had chronic cough , and asthmatic bronchitis  I last saw Ms. Dull in February 2011.  She has been doing well until the past few weeks.  She has noticed more problems with allergies, and this has caused some tightness in her chest.  She ran out of her inhalers a few weeks ago, and has noticed a difference.  Spring is typically her worst season.  She was able to stop spiriva.  She has some nasal congestion.  She has occasional dry cough.  She denies fever, wheeze, or hemoptysis.   Review of Systems     Objective:   Physical Exam Filed Vitals:   01/14/11 1200 01/14/11 1203  BP:  124/78  Pulse:  99  Temp: 98.2 F (36.8 C)   TempSrc: Oral   Height: 5\' 2"  (1.575 m)   Weight: 189 lb 3.2 oz (85.821 kg)   SpO2:  99%   General - Obese, healthy, no distress HEENT - No sinus tenderness, clear drainage, MP 3, no oral lesions, no LAN Cardiac - S1S2 regular, no murmur Chest - no wheeze/rales Abd - soft, non-tender, normal bowel sounds Ext - no edema Neuro - normal strength, A&O x 3, CN intact Psych - normal behavior and mood     Spirometry today>>FEV1 1.50(77%), FEV1% 74  Assessment & Plan:   ASTHMA She has been stable until recently.  She has worsening symptoms with allergens and since she ran out of her inhaler.  Will resume advair but at 250 strength.  Have given samples for these.  She is to continue as needed xopenex.  Allergic rhinitis She can use zyrtec or equivalent as needed.    Updated Medication List Outpatient Encounter Prescriptions as of 01/14/2011  Medication Sig Dispense Refill  . amitriptyline (ELAVIL) 10 MG tablet Take 10 mg by mouth at bedtime.        Marland Kitchen atorvastatin (LIPITOR) 10 MG tablet Take 10 mg by mouth daily.        Marland Kitchen levalbuterol (XOPENEX HFA) 45 MCG/ACT inhaler Two puffs up to four times per day as needed  1 Inhaler  11  .  PARoxetine (PAXIL) 10 MG tablet Take 10 mg by mouth every morning.        Marland Kitchen DISCONTD: Fluticasone-Salmeterol (ADVAIR DISKUS) 500-50 MCG/DOSE AEPB Inhale 1 puff into the lungs 2 (two) times daily.  60 each  0  . DISCONTD: levalbuterol (XOPENEX HFA) 45 MCG/ACT inhaler Inhale 2 puffs into the lungs every 4 (four) hours as needed for wheezing.  1 Inhaler  0  . cetirizine (ZYRTEC) 10 MG tablet One daily as needed for allergies  30 tablet  11  . Fluticasone-Salmeterol (ADVAIR) 250-50 MCG/DOSE AEPB One puff twice per day  60 each  11

## 2011-01-14 NOTE — Assessment & Plan Note (Signed)
She has been stable until recently.  She has worsening symptoms with allergens and since she ran out of her inhaler.  Will resume advair but at 250 strength.  Have given samples for these.  She is to continue as needed xopenex.

## 2011-02-04 NOTE — Assessment & Plan Note (Signed)
[facility]                             PULMONARY OFFICE NOTE   NAME:Jane [name], Jane [name]                     MRN:          [id number]  DATE:02/24/2007                            DOB:          01-05-1951    CHIEF COMPLAINT:  Evaluate cough.   HISTORY OF PRESENT ILLNESS:  A 60 year old female who has had chronic  cough for three months. She initially went to the emergency room in  April for this and treated with nebulizer treatments and diagnosed with  an asthmatic bronchitic flare. She was then hospitalized for 5 days. She  apparently gives a history of having had asthma as a child, but grew out  of this and then became worse in 2007 and worse again this year. She has  been on Advair 250/50 one spray b.i.d., but it causes throat irritation.  The cough is mainly nocturnal. It is productive of clear mucus,  occasionally yellow. She is a lifelong never smoker. She does note acid  reflux symptoms that are worse at night. Note, she has never been  treated for reflux. There is no history of weight change, loss of  appetite and no history of difficulty swallowing or sore throat,  headaches, nasal congestion, sneezing, itching or earache. The patient  is referred for further evaluation.   PAST MEDICAL HISTORY:   MEDICAL:  1. History of hypertension.  2. History of colon cancer in 2004, with resection.  3. History of tubal ligation.  4. Appendectomy.   MEDICATION ALLERGIES:  None.   CURRENT MEDICATIONS:  1. Mobic 15 mg daily.  2. Multivitamin every day.  3. Paxil 30 mg daily.  4. Fish oil daily.  5. Amlodipine 10 mg daily.  6. Advair one spray b.i.d. 250/50 strength.  7. Xopenex HFR p.r.n.   SOCIAL HISTORY:  Lifelong never smoker. Lives with her children. Works  in an office like environment.   FAMILY HISTORY:  Positive for asthma in her mother. Cancer in a paternal  grandmother.   PHYSICAL EXAMINATION:  Temperature 97.9, blood pressure 110/78,  pulse  98, saturation is 99% on room air.  CHEST: Showed distant breath sounds with prolonged expiratory phase.  Occasional wheezes.  CARDIAC: Showed a regular rate and rhythm without S3. Normal S1, S2.  ABDOMEN: Soft, nontender.  EXTREMITIES: Showed no edema or clubbing or venous disease.  SKIN: Was clear.  NEUROLOGIC: Was intact.  HEENT: Showed no jugular venous distention. No lymphadenopathy.  Oropharynx was clear.  NECK: Supple.   LABORATORY DATA:  Pulmonary functions are obtained showing an FEV1 of  51% predicted, FVC of 56% predicted, FEF 25/75 at 33% predicted. Chest x-  ray showed bronchial wall thickening.   IMPRESSION:  Is that of asthmatic bronchitis with significant reflux  disease, increased lower airway inflammation.   RECOMMENDATIONS:  Protonix 40 mg daily. Reflux diet instruction was  given. Continue Advair as is and discontinue further fish oil. Will see  the patient back in return visit in one month.     Charlcie Cradle Delford Field, MD, [facility]  Electronically Signed    PEW/MedQ  DD: 02/25/2007  DT: 02/25/2007  Job #: [id number]   cc:   Samul Dada, M.D.  Holley Bouche, M.D.

## 2011-02-04 NOTE — Assessment & Plan Note (Signed)
[facility]                             PULMONARY OFFICE NOTE   NAME:[name] [name] DEWITT                     MRN:          [id number]  DATE:03/15/2007                            DOB:          23-Jan-1951    HISTORY OF PRESENT ILLNESS:  The patient is a 60 year old African  American female patient of Dr. Lynelle Doctor who has a known history of  asthmatic bronchitis with significant gastroesophageal reflux.  The  patient was recently seen for pulmonary consult two weeks ago.  At that  time, was improving and was recommending to continue on Protonix daily.  The patient reports she was doing exceptionally well until four days ago  when she developed a productive cough with thick green sputum, nasal  congestion, fever, chills and wheezing.  Unfortunately, the patient ran  out of her Advair and Protonix, reporting that she was unable to fill  them due to financial constraints.  The patient denies any hemoptysis,  orthopnea, PND or leg swelling.   PAST MEDICAL HISTORY:  Reviewed.   CURRENT MEDICATIONS:  Reviewed.   PHYSICAL EXAMINATION:  GENERAL:  The patient is a pleasant female in no  acute distress.  VITAL SIGNS:  She is afebrile with stable vital signs.  Her O2  saturation is 96% on room air.  HEENT:  Unremarkable.  NECK:  Supple without cervical adenopathy.  No JVD.  LUNGS:  Sounds reveal a few scattered wheezes.  CARDIAC:  Regular rate and rhythm.  ABDOMEN:  Soft and nontender.  EXTREMITIES:  Warm without any edema.   IMPRESSION/PLAN:  A mild asthmatic bronchitic flare.  The patient is to  begin Avelox times five days.  Samples given.  Add in Mucinex DM twice  daily.  The patient is to restart Advair and Protonix as recommended.  Samples were given.  The patient may also use Mucinex DM twice daily for  cough and congestion.  Add in Phenergan VC with codeine for breakthrough  coughing, #8 ounces was given.  No refills.  The patient is aware of  side  effects.  The patient will follow back up with Dr. Delford Field as  scheduled in 2-3 weeks or sooner if needed.  The patient is to contact  our office for sooner followup if symptoms do not improve or worsen.     Rubye Oaks, NP  Electronically Signed      Charlcie Cradle Delford Field, MD, [facility]  Electronically Signed   TP/MedQ  DD: 03/15/2007  DT: 03/16/2007  Job #: [id number]

## 2011-02-04 NOTE — Letter (Signed)
Feb 19, 2010    To Whom It May Concern:   RE:  [name], [name]  MRN:  [id number]  /  DOB:  01-May-1951   Jane [name] is under my care at [facility] for her asthma.  If  you have any questions regarding this, please feel free to contact me at  [phone].    Sincerely,      Coralyn Helling, MD  Electronically Signed    VS/MedQ  DD: 02/19/2010  DT: 02/20/2010  Job #: [id number]

## 2011-02-07 NOTE — H&P (Signed)
NAME:  Jane [name], Jane [name]              ACCOUNT NO.:  [id number]   MEDICAL RECORD NO.:  [id number]          PATIENT TYPE:  EMS   LOCATION:  ED                           FACILITY:  [facility]   PHYSICIAN:  Hollice Espy, M.D.DATE OF BIRTH:  06/28/1951   DATE OF ADMISSION:  01/14/2007  DATE OF DISCHARGE:                              HISTORY & PHYSICAL   PCP:  Dr. Leonides Sake.   CHIEF COMPLAINT:  Shortness of breath.   HISTORY OF PRESENT ILLNESS:  The patient is a 60 year old African-  American female with past medical history of asthma, colon cancer status  post resection, and depression who for the last 4 days has had  progressively worsening shortness of breath due to the increased pollen  count.  It got to the point where she had significant dyspnea on  exertion.  She became concerned and came in to the emergency room.  Interestingly, she does not list any inhalers on her medication regimen  and has not been taking anything to help treat her symptoms.  When she  came in to the emergency room, she was given Solu-Medrol and breathing  treatments which improved her breathing somewhat but she was still quite  wheezy and dyspneic.  In addition, she had a chest x-ray done which  noted no signs of any pneumonia or underlying etiology.  Currently, the  patient is feeling a little bit better.   REVIEW OF SYSTEMS:  She denies any headaches, vision changes, dysphagia,  chest pain, palpitations.  She does have some shortness of breath,  although better than when she initially came in to the ER, as well as  some wheezing and coughing.  She denies any abdominal pain.  No  hematuria, dysuria, constipation, diarrhea, focal extremity numbness,  weakness, or pain.  Her review of systems is otherwise negative.   PAST MEDICAL HISTORY:  Includes:  1. Asthma as a child which has started recurring as of last year.  2. Obesity.  3. Anxiety.  4. Depression.  5. Arthritis.  6. Colon cancer status post  resection.   MEDICATIONS:  She is on Mobic, Paxil, and ProAir.   ALLERGIES:  She is allergic to MORPHINE.   SOCIAL HISTORY:  She denies any tobacco, alcohol, or drug use.   FAMILY HISTORY:  Noncontributory.   PHYSICAL EXAMINATION:  VITAL SIGNS:  On admission, temp 98.6, heart rate  122, blood pressure 159/87, respirations 24.  O2 sat 96% on room air.  GENERAL:  The patient is alert and oriented but dyspneic even with  talking.  HEENT:  Normocephalic, atraumatic.  Her mucous membranes are moist.  NECK:  She has no carotid bruits.  HEART:  Regular rhythm, mild tachycardia.  LUNGS:  She has decreased breath sounds more so at the bases.  ABDOMEN:  Soft, nontender, nondistended.  Positive bowel sounds.  EXTREMITIES:  Show no clubbing, cyanosis, or edema.   LAB WORK:  BNP less than 30.  Sodium 140, potassium 3.4, chloride 103,  bicarb 29, BUN 14, creatinine 0.7, glucose 125.  White count 7.9 with no  shift, H&H 12.7 and 38.4, MCV  84, platelet count 241.  CPK 105, MB 3.6,  troponin I less than 0.05.  D-dimer is normal at 0.36.  Chest x-ray  shows peribronchial thickening without focal airspace disease.   ASSESSMENT AND PLAN:  1. Asthma exacerbation.  This is likely secondary to the increased      pollen count.  We will put her on Xopenex and Atrovent nebs plus      Solu-Medrol, and cover her with sliding scale and additional      oxygen.  She is moving air somewhat well, so I think that hopefully      this will be a short hospitalization.  2. Obesity.  3. Anxiety.  We will put her on p.r.n. Xanax plus keep her on Xopenex      instead of albuterol to decrease tachycardia.      Hollice Espy, M.D.  Electronically Signed     SKK/MEDQ  D:  01/15/2007  T:  01/15/2007  Job:  [id number]   cc:   Holley Bouche, M.D.  Fax: [phone]   Samul Dada, M.D.  Fax: [phone]

## 2011-02-07 NOTE — Op Note (Signed)
NAME:  Jane [name], Jane [name]                        ACCOUNT NO.:  [id number]   MEDICAL RECORD NO.:  [id number]                   PATIENT TYPE:  INP   LOCATION:  0003                                 FACILITY:  [facility]   PHYSICIAN:  Ollen Gross. Vernell Morgans, M.D.              DATE OF BIRTH:  07-May-1951   DATE OF PROCEDURE:  12/01/2002  DATE OF DISCHARGE:                                 OPERATIVE REPORT   PREOPERATIVE DIAGNOSES:  Rectal cancer.   POSTOPERATIVE DIAGNOSES:  Rectal cancer.   PROCEDURE:  Low anterior resection and appendectomy.   SURGEON:  Ollen Gross. Carolynne Edouard, M.D.   ASSISTANT:  Sheppard Plumber. Earlene Plater, M.D.   ANESTHESIA:  General endotracheal.   DESCRIPTION OF PROCEDURE:  After informed consent was obtained, the patient  was brought to the operating room, placed in the supine position in the  operating table. After adequate induction of general anesthesia, the patient  was placed in lithotomy position. Initially a rigid sigmoidoscope was  inserted into the rectum and insufflated. The tumor in question was able to  be identified on the anterior rectal wall at about 10-11 cm. At this point,  the scope was removed, the patient's legs were lowered and the abdomen and  perineum were prepped with Betadine and draped in the usual sterile manner.  A lower midline incision was made using a 10 blade knife. This incision was  carried down the skin and subcutaneous tissue using the Bovie electrocautery  until the linea alba was identified. The linea alba was also incised with  the electrocautery. The preperitoneal space was prepared bluntly with the  finger until the peritoneum was opened. The rest of the incision was opened  under direct vision with the electrocautery. A Balfour retractor was used to  retract the sidewalls of the abdomen laterally. A small part of the omentum  was then eased to the anterior abdominal wall and this was taken down  sharply with the electrocautery. Once this was  accomplished, the patient was  placed in Trendelenburg and a Balfour extender attachment was applied to the  Engineer, manufacturing. Wet laps were used to pack the small bowel and omentum  into the right upper quadrant and this was held in place with a malleable  retractor for the Lanett. The pelvis was very free. There were no tumor  implants identified. A site was chosen on the lower sigmoid colon for  division of the bowel. The descending and sigmoid colon were mobilized by  incising the retroperitoneal attachments along the white line of Toldt and  then using blunt dissection to free the descending and sigmoid colon on its  mesentery. Once the site was chosen for division of the sigmoid colon, the  mesentery at this point was opened sharply with the electrocautery. A GIA-75  stapler was placed across the colon at this point, clamped and fired which  divided the bowel. The upper portion  of the mesorectum was then clamped with  Kelly clamps, divided and ligated with 2-0 silk ties. The remaining vessel  of this upper portion was also suture ligated with a 2-0 silk stitch. At  this point, the dissection was carried along the inner surface of the sacrum  in the mesorectum using some blunt finger dissection as well as sharper  dissection with the harmonic scalpel. This was carried out circumferentially  until we were below the level of the tumor. Once we were below the level of  the tumor, the mesorectum was divided with the harmonic scalpel until the  edge of the bowel wall had been cleaned of this mesorectal tissue  circumferentially. The pelvis was hemostatic. There was no obvious bleeding.  At this point, the tumor was able to be retracted up into the abdomen. A  reticulating TA-60 stapler was then placed in the pelvis below the level of  the tumor, clamped and fired. The bowel was then divided proximal to the  stable line with a 10 blade knife. The specimen was removed from the patient  and  examined on the back table. There was at least a centimeter and a half  of grossly clean margin below the tumor. The specimen was then sent the  pathology for further evaluation. Next, the proximal colon was examined.  This portion of the colon was pink and viable and looked very healthy. After  mobilizing the descending colon from its retroperitoneal attachment, there  was plenty of length on the descending colon and sigmoid colon to reach deep  into the pelvis. The staple line on the proximal bowel was then excised  sharply with the electrocautery. Once the bowel was opened, the bowel was  sized and it appeared as though a 29 mm Ethicon sizer fit best. At this  point, the 2-0 Prolene pursestring stitch was placed circumferentially  around the opening of the proximal bowel. The mushroom tip to the EEA  stapler was then placed within the lumen of the bowel and the pursestring  stitch was cinched down and tied. Next, an EEA stapler was placed within the  rectum from below and brought up until the stapler was at the end of the  pouch. The stapling device was lined up so that the protruding spike came  out just anterior to the staple line on the distal segment. The spike was  opened up as far as it would go. The proximal mushroom was then placed on  the spike and anchored. Care was taken to make sure there were no twists to  the colon. The stapling device was then cinched down as far as it could go.  This pressure was held for several minutes and then the stapling device was  clamped and fired creating a circumferential stapled anastomosis. The device  was then carefully removed from the rectum under direct vision. The proximal  and distal donuts of tissue were complete and were sent to pathology  separately for further evaluation as the proximal and distal margins. The  anastomosis was then examined both externally and internally with a rigid sigmoidoscope. Saline was placed in the pelvis and  the colon was insufflated  with the rigid sigmoidoscope. There were no bubbles noted and the  anastomosis appeared to be intact. The abdomen was then irrigated at this  point with copious amounts of saline. Some of the sponges were removed and  the cecum was then identified. The appendix was small and normal appearing.  The mesoappendix  was taken down by serially clamping with hemostats,  dividing and ligating with 2-0 silk ties until the appendix was completely  free of its mesentery. The appendix at its base was then clamped with the  hemostat, the clamp was then moved distally a couple of millimeters. At the  prior clamp site, an #0 chromic tie was placed. The appendix was then  divided just distal to the chromic tie. The appendiceal stump was fulgurated  and then dunked with a 3-0 silk Z stitch. The rest of the abdomen was then  inspected. The liver felt normal although there were some adhesions over the  top of the liver. The NG tube was palpated and in good position. The bowel  was run in its entirety and there were no other abnormalities identified.  The abdomen was then irrigated with copious amounts of saline. The fascia of  the anterior abdominal wall was then closed with two running #1 PDS sutures.  The subcutaneous tissue was irrigated with copious amounts of saline and the  skin was closed with staples. The patient tolerated the procedure well and  at the end of the case all needle, sponge and instrument counts were  correct. The patient was then awakened and taken to the recovery room in  stable condition.                                               Ollen Gross. Vernell Morgans, M.D.    PST/MEDQ  D:  12/01/2002  T:  12/01/2002  Job:  [id number]

## 2011-02-07 NOTE — Discharge Summary (Signed)
NAME:  Jane [name], ROANHORSE              ACCOUNT NO.:  [id number]   MEDICAL RECORD NO.:  [id number]          PATIENT TYPE:  INP   LOCATION:  1329                         [facility]ILITY[facility]  [facility]   PHYSICIAN:  Barnetta Chapel, MDDATE OF BIRTH:  1950-12-24   DATE OF ADMISSION:  01/14/2007  DATE OF DISCHARGE:                               DISCHARGE SUMMARY   PRIMARY CARE [facility]:  Dr. Leonides Sake.   ADMITTING DIAGNOSES:  1. Asthma exacerbation.  2. Obesity.  3. Anxiety.   DISCHARGE DIAGNOSES:  1. Acute asthma exacerbation.  2. Insomnia.  3. Postnasal drip, suspect allergic rhinitis.  4. Obesity.  5. Anxiety.   CONSULTATIONS:  None.   DISCHARGE MEDICATIONS:  To be dictated.   IMAGING STUDIES DONE INCLUDES:  Chest x-ray done on January 14, 2007.  This revealed peribronchial thickening without focal airspace disease.   BRIEF HISTORY AND HOSPITAL COURSE:  Please refer to the H&P done January 14, 2007.  The patient is a 60 year old female with past medical history  of significant for asthma, obesity, anxiety, depression, arthritis and  lung cancer status post resection.  The patient has had asthma since  childhood.  This initially improved significantly, but it has been  reoccurring every year around this time of the year.  The patient has  allergies but has never seen an allergist.  The patient presented again  with another episode of asthma exacerbation.  She was said to be  wheezing and feeling short of breath for 4 days prior to admission.  On  further questioning, she admitted to be having cough with postnasal  drips symptoms.  The patient does not take inhalers regularly.  The  patient was admitted to the regular medical floor.  She was started on  IV Solu-Medrol, nebs Xopenex and Atrovent.  Singulair 10 mg p.o. once  daily was started.  The allergy and postnasal drip symptoms have been  treated with Claritin 10 mg p.o. once daily, Sudafed 120 mg p.o. twice  daily, and Nasonex nasal  spray and saline nasal spray.  The patient has  continued to  improve.  The peak flow this morning was only 190.  We will continue  above management and continue to assess the patient.   DISCHARGE INSTRUCTIONS:  To be dictated.      Barnetta Chapel, MD  Electronically Signed     SIO/MEDQ  D:  01/17/2007  T:  01/17/2007  Job:  [id number]   cc:   Leonides Sake, MD

## 2011-06-23 ENCOUNTER — Encounter (HOSPITAL_BASED_OUTPATIENT_CLINIC_OR_DEPARTMENT_OTHER): Payer: BC Managed Care – PPO | Admitting: Oncology

## 2011-06-23 ENCOUNTER — Other Ambulatory Visit (HOSPITAL_COMMUNITY): Payer: Self-pay | Admitting: Oncology

## 2011-06-23 DIAGNOSIS — Z85048 Personal history of other malignant neoplasm of rectum, rectosigmoid junction, and anus: Secondary | ICD-10-CM

## 2011-06-23 LAB — CBC WITH DIFFERENTIAL/PLATELET
Basophils Absolute: 0 10*3/uL (ref 0.0–0.1)
EOS%: 1.6 % (ref 0.0–7.0)
Eosinophils Absolute: 0.1 10*3/uL (ref 0.0–0.5)
LYMPH%: 29.2 % (ref 14.0–49.7)
MCH: 27.9 pg (ref 25.1–34.0)
MCV: 86.7 fL (ref 79.5–101.0)
MONO%: 7.2 % (ref 0.0–14.0)
Platelets: 220 10*3/uL (ref 145–400)
RBC: 4.58 10*6/uL (ref 3.70–5.45)
RDW: 12.9 % (ref 11.2–14.5)

## 2011-06-23 LAB — COMPREHENSIVE METABOLIC PANEL WITH GFR
ALT: 17 U/L (ref 0–35)
AST: 20 U/L (ref 0–37)
Albumin: 4.4 g/dL (ref 3.5–5.2)
Alkaline Phosphatase: 80 U/L (ref 39–117)
BUN: 17 mg/dL (ref 6–23)
CO2: 28 meq/L (ref 19–32)
Calcium: 9.1 mg/dL (ref 8.4–10.5)
Chloride: 103 meq/L (ref 96–112)
Creatinine, Ser: 0.74 mg/dL (ref 0.50–1.10)
Glucose, Bld: 125 mg/dL — ABNORMAL HIGH (ref 70–99)
Potassium: 4 meq/L (ref 3.5–5.3)
Sodium: 140 meq/L (ref 135–145)
Total Bilirubin: 0.5 mg/dL (ref 0.3–1.2)
Total Protein: 7 g/dL (ref 6.0–8.3)

## 2011-06-23 LAB — LACTATE DEHYDROGENASE: LDH: 154 U/L (ref 94–250)

## 2011-11-25 ENCOUNTER — Encounter: Payer: Self-pay | Admitting: Gastroenterology

## 2012-06-01 ENCOUNTER — Other Ambulatory Visit: Payer: Self-pay | Admitting: Oncology

## 2012-06-01 ENCOUNTER — Other Ambulatory Visit: Payer: Self-pay | Admitting: Obstetrics and Gynecology

## 2012-06-01 DIAGNOSIS — Z1231 Encounter for screening mammogram for malignant neoplasm of breast: Secondary | ICD-10-CM

## 2012-06-07 ENCOUNTER — Ambulatory Visit: Payer: BC Managed Care – PPO

## 2012-06-11 ENCOUNTER — Ambulatory Visit
Admission: RE | Admit: 2012-06-11 | Discharge: 2012-06-11 | Disposition: A | Discharge status: Home / Self Care | Payer: BC Managed Care – PPO | Source: Ambulatory Visit | Attending: Obstetrics and Gynecology | Admitting: Obstetrics and Gynecology

## 2012-06-11 DIAGNOSIS — Z1231 Encounter for screening mammogram for malignant neoplasm of breast: Secondary | ICD-10-CM

## 2012-06-17 ENCOUNTER — Telehealth: Payer: Self-pay | Admitting: *Deleted

## 2012-06-17 NOTE — Telephone Encounter (Signed)
Patient confirmed over the phone the new date and time on 06-24-2012 starting at 1:30

## 2012-06-24 ENCOUNTER — Other Ambulatory Visit (HOSPITAL_BASED_OUTPATIENT_CLINIC_OR_DEPARTMENT_OTHER): Payer: BC Managed Care – PPO | Admitting: Lab

## 2012-06-24 ENCOUNTER — Encounter: Payer: Self-pay | Admitting: Oncology

## 2012-06-24 ENCOUNTER — Ambulatory Visit (HOSPITAL_BASED_OUTPATIENT_CLINIC_OR_DEPARTMENT_OTHER): Payer: BC Managed Care – PPO | Admitting: Oncology

## 2012-06-24 ENCOUNTER — Telehealth: Payer: Self-pay | Admitting: Oncology

## 2012-06-24 VITALS — BP 130/82 | HR 100 | Temp 99.3°F | Resp 22 | Ht 62.0 in | Wt 199.8 lb

## 2012-06-24 DIAGNOSIS — C20 Malignant neoplasm of rectum: Secondary | ICD-10-CM

## 2012-06-24 LAB — CBC WITH DIFFERENTIAL/PLATELET
EOS%: 1.2 % (ref 0.0–7.0)
LYMPH%: 30 % (ref 14.0–49.7)
MCH: 28.4 pg (ref 25.1–34.0)
MCV: 86.9 fL (ref 79.5–101.0)
MONO%: 5.5 % (ref 0.0–14.0)
RBC: 4.5 10*6/uL (ref 3.70–5.45)
RDW: 13.1 % (ref 11.2–14.5)

## 2012-06-24 LAB — COMPREHENSIVE METABOLIC PANEL (CC13)
AST: 29 U/L (ref 5–34)
Albumin: 4.1 g/dL (ref 3.5–5.0)
Alkaline Phosphatase: 93 U/L (ref 40–150)
BUN: 16 mg/dL (ref 7.0–26.0)
Potassium: 4.1 mEq/L (ref 3.5–5.1)
Sodium: 143 mEq/L (ref 136–145)
Total Bilirubin: 0.8 mg/dL (ref 0.20–1.20)

## 2012-06-24 NOTE — Telephone Encounter (Signed)
Printed and gv pt appt...sed

## 2012-06-24 NOTE — Patient Instructions (Signed)
Please call Dr. Ardell Isaacs office to see if you need another colonoscopy.  Your last colonoscopy was on 12/29/2008.

## 2012-06-24 NOTE — Progress Notes (Signed)
This office note has been dictated.  #[id number]

## 2012-06-25 NOTE — Progress Notes (Signed)
CC:   Jane T. Russella Dar, MD, Jane [name]. Jane [name], M.D. Jane [name], Ph.D., M.D. Jane [name], M.D. Jane [name]. Jane [name], M.D.  PROBLEM LIST: 1. 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  The patient underwent a low anterior resection on     12/01/2002.  Tumor stage was T2 N0, stage I.  The patient has     remained disease free on no further therapy.  Her most recent     colonoscopy was carried out on 12/29/2008. 2. Hypertension. 3. History of depression.  MEDICATIONS: 1. Multivitamin 1 daily. 2. Unknown blood pressure medicine. 3. Paxil 10 mg daily. 4. Meloxicam SoluMatrix capsules as per protocol. 5. MEL3-12-03 in subjects with osteoarthritis of the knee or hip. 6. Patient declines pneumonia shot and flu shot as it makes her sick.  SMOKING HISTORY:  The patient denies any history of cigarette smoking.  HISTORY:  Jane [name] was seen today for followup of her stage I adenocarcinoma of the rectum.  The tumor was located on the anterior rectal wall at about the 10 or 11 o'clock position and measured 2.5 cm. There was invasion into the muscularis propria.  All 6 lymph nodes were negative.  There was no lymphovascular invasion.  Jane [name] was last seen here on 06/23/2011 and before that on 06/14/2010.  She prefers to see [place] on a yearly basis.  She is without complaints today.  No history of blood in the stools, any difficulty with her bowel movements, abdominal pain, sense of ill health.  She is working part-time at [employer].  She no longer works at [employer].  Her children are out of college.  She tells me that she has an appointment to see Dr. Ambrose [name] next week.  As stated, her last colonoscopy was on 12/29/2008.  I have recommended that she call Dr. Ardell Isaacs office to see if she is due for another colonoscopy.  PHYSICAL EXAM:  Jane [name] looks well.  Weight is 199.8 pounds, height 5 feet 2 inches, body surface area 1.99 sq m.  Blood pressure  130/82. Other vital signs are normal.  Temperature is 99.3 today.  There is no scleral icterus.  Mouth and pharynx are benign.  No peripheral adenopathy palpable.  Heart and lungs are normal.  Breasts were not examined.  No axillary or inguinal adenopathy.  Abdomen is somewhat obese, nontender with no organomegaly or masses palpable.  Extremities: No peripheral edema or clubbing.  Rectal exam was not carried out today. Neurologic exam is normal.  LABORATORY DATA:  White count 5.0, ANC 3.2, hemoglobin 12.8, hematocrit 39.1, platelets 194,000.  Chemistries today are notable for a glucose of 133, otherwise normal.  Glucose on 06/23/2011 was 125.  The patient's last CEA was 2.3 on 06/14/2010.  IMAGING STUDIES: 1. Chest x-ray, 2 view, from 10/17/2009 showed some peribronchial     thickening, otherwise negative. 2. Digital screening mammogram on 08/21/2010 was negative. 3. Digital bilateral screening mammogram on 06/11/2012 was negative.  IMPRESSION AND PLAN:  Jane [name] is now out past 9-1/2 years from the time of diagnosis without evidence of recurrence.  I have recommended that she contact Dr. Ardell Isaacs office to see whether she is due for another colonoscopy.  Her last colonoscopy was on 12/29/2008.  I told Jane [name] that she did not have to come back for followup; however, she would prefer to see [place] on a yearly basis.  We will therefore plan to see  her in 1 year at which time we will check CBC and chemistries.    ______________________________ Samul Dada, M.D. DSM/MEDQ  D:  06/24/2012  T:  06/24/2012  Job:  [id number]

## 2012-06-28 ENCOUNTER — Telehealth: Payer: Self-pay

## 2012-06-28 NOTE — Telephone Encounter (Signed)
Scheduled patient for Recall Colonoscopy on 08/06/12 at 2:00pm and nurse visit on 08/02/12 at 8:30am. Pt agreed and verbalized understanding of appointments.

## 2012-06-28 NOTE — Telephone Encounter (Signed)
Message copied by Jessee Avers on Mon Jun 28, 2012  8:54 AM ------      Message from: Claudette Head T      Created: Fri Jun 25, 2012  5:40 PM       Last colonoscopy in 12/2008 indicates she was due for colonoscopy in 12/2011. Please contact her to schedule a colonoscopy.                   ----- Message -----         From: Samul Dada, MD         Sent: 06/25/2012   2:25 PM           To: Meryl Dare, MD,FACG, Billie Lade, MD, #

## 2012-08-02 ENCOUNTER — Encounter: Payer: Self-pay | Admitting: Adult Health

## 2012-08-02 ENCOUNTER — Ambulatory Visit (AMBULATORY_SURGERY_CENTER): Payer: BC Managed Care – PPO | Admitting: *Deleted

## 2012-08-02 ENCOUNTER — Telehealth: Payer: Self-pay | Admitting: Pulmonary Disease

## 2012-08-02 ENCOUNTER — Ambulatory Visit (INDEPENDENT_AMBULATORY_CARE_PROVIDER_SITE_OTHER): Payer: BC Managed Care – PPO | Admitting: Adult Health

## 2012-08-02 VITALS — Ht 62.0 in | Wt 200.6 lb

## 2012-08-02 VITALS — BP 114/62 | HR 110 | Temp 98.3°F | Ht 62.0 in | Wt 201.6 lb

## 2012-08-02 DIAGNOSIS — J45909 Unspecified asthma, uncomplicated: Secondary | ICD-10-CM

## 2012-08-02 DIAGNOSIS — Z1211 Encounter for screening for malignant neoplasm of colon: Secondary | ICD-10-CM

## 2012-08-02 DIAGNOSIS — Z85038 Personal history of other malignant neoplasm of large intestine: Secondary | ICD-10-CM

## 2012-08-02 DIAGNOSIS — Z23 Encounter for immunization: Secondary | ICD-10-CM

## 2012-08-02 MED ORDER — ALBUTEROL SULFATE HFA 108 (90 BASE) MCG/ACT IN AERS: 2.0000 | INHALATION_SPRAY | RESPIRATORY_TRACT | Status: AC | PRN

## 2012-08-02 MED ORDER — PREDNISONE 10 MG PO TABS: ORAL_TABLET | ORAL | Status: DC

## 2012-08-02 MED ORDER — FLUTICASONE-SALMETEROL 250-50 MCG/DOSE IN AEPB: 1.0000 | INHALATION_SPRAY | Freq: Two times a day (BID) | RESPIRATORY_TRACT | Status: AC

## 2012-08-02 MED ORDER — AZITHROMYCIN 250 MG PO TABS: ORAL_TABLET | ORAL | Status: AC

## 2012-08-02 MED ORDER — HYDROCODONE-HOMATROPINE 5-1.5 MG/5ML PO SYRP: 5.0000 mL | ORAL_SOLUTION | Freq: Four times a day (QID) | ORAL | Status: AC | PRN

## 2012-08-02 MED ORDER — MOVIPREP 100 G PO SOLR: ORAL | Status: DC

## 2012-08-02 MED ORDER — LEVALBUTEROL HCL 0.63 MG/3ML IN NEBU
0.6300 mg | INHALATION_SOLUTION | Freq: Once | RESPIRATORY_TRACT | Status: AC
  Administered 2012-08-02: 0.63 mg via RESPIRATORY_TRACT

## 2012-08-02 NOTE — Progress Notes (Signed)
  Subjective:    Patient ID: Jane [name], female    DOB: Nov 22, 1950, 61 y.o.   MRN: [id number]  HPI 61 yo female, never smoker , with known hx of chronic cough , and asthmatic bronchitis  08/02/2012 Acute OV  Patient presents for an acute office visit. She complains of increased SOB, wheezing, prod cough with light green mucus, chest tightness x3days - denies f/c/s.  has colon scheduled for 11-15 Patient has not been seen in greater than 1.5 years Says her asthma. Has been doing fairly well up until the last 5 days She has had no increase stop the use. Emergency room visits or hospitalizations for her asthma. Over the last year She denies any hemoptysis, orthopnea, PND, or leg swelling. Has been using NyQuil over-the-counter not much relief    Review of Systems Constitutional:   No  weight loss, night sweats,  Fevers, chills, fatigue, or  lassitude.  HEENT:   No headaches,  Difficulty swallowing,  Tooth/dental problems, or  Sore throat,                No sneezing, itching, ear ache,  +nasal congestion, post nasal drip,   CV:  No chest pain,  Orthopnea, PND, swelling in lower extremities, anasarca, dizziness, palpitations, syncope.   GI  No heartburn, indigestion, abdominal pain, nausea, vomiting, diarrhea, change in bowel habits, loss of appetite, bloody stools.   Resp:    No coughing up of blood.    No chest wall deformity  Skin: no rash or lesions.  GU: no dysuria, change in color of urine, no urgency or frequency.  No flank pain, no hematuria   MS:  No joint pain or swelling.  No decreased range of motion.  No back pain.  Psych:  No change in mood or affect. No depression or anxiety.  No memory loss.         Objective:   Physical Exam GEN: A/Ox3; pleasant , NAD  HEENT:  [place]/AT,  EACs-clear, TMs-wnl, NOSE-clear drainage  THROAT-clear, no lesions, no postnasal drip or exudate noted.   NECK:  Supple w/ fair ROM; no JVD; normal carotid impulses w/o bruits; no  thyromegaly or nodules palpated; no lymphadenopathy.  RESP  bilateral faint expiratory wheezes no accessory muscle use, no dullness to percussion  CARD:  RRR, no m/r/g  , no peripheral edema, pulses intact, no cyanosis or clubbing.  GI:   Soft & nt; nml bowel sounds; no organomegaly or masses detected.  Musco: Warm bil, no deformities or joint swelling noted.   Neuro: alert, no focal deficits noted.    Skin: Warm, no lesions or rashes         Assessment & Plan:

## 2012-08-02 NOTE — Addendum Note (Signed)
Addended by: Boone Master E on: 08/02/2012 12:11 PM   Modules accepted: Orders

## 2012-08-02 NOTE — Assessment & Plan Note (Signed)
Exacerbation secondary to URI - Plan Prednisone taper over next week.  Mucinex DM Twice daily  As needed  Cough/congestion  Fluids and rest  Zpack -to have on hold if symptoms worsen with discolored mucus  Hydromet 1-2 tsp every 4-6 hr As needed  Cough, may make you sleepy.  Please contact office for sooner follow up if symptoms do not improve or worsen or seek emergency care  follow up Dr. Craige Cotta  In 2-3 months  Continue on Advair 250/50 1 puff Twice daily  - brush/rinse and gargle after use.

## 2012-08-02 NOTE — Patient Instructions (Addendum)
Prednisone taper over next week.  Mucinex DM Twice daily  As needed  Cough/congestion  Fluids and rest  Zpack -to have on hold if symptoms worsen with discolored mucus  Hydromet 1-2 tsp every 4-6 hr As needed  Cough, may make you sleepy.  Please contact office for sooner follow up if symptoms do not improve or worsen or seek emergency care  follow up Dr. Craige Cotta  In 2-3 months  Continue on Advair 250/50 1 puff Twice daily  - brush/rinse and gargle after use.

## 2012-08-02 NOTE — Progress Notes (Signed)
Reviewed and agree with assessment/plan. 

## 2012-08-02 NOTE — Telephone Encounter (Signed)
Pt last seen by VS in April 2012 and will meed OV for samples. Pt states she is scheduled for a colonoscopy later this week and per GI they could hear wheezing during her exam and thought she needed to have her mediations. Pt has not been using her Advair or Xopenex and states she has not needed them and had been doing well. Pt's breathing has worsened with the colder temperatures outside. She agrees to see TP today at 10:30 for evaluation and can get samples at that time if TP wants her to restart the two medications requested.

## 2012-08-02 NOTE — Progress Notes (Signed)
Pt here for PV today. Pt says that she was sick this weekend with a sore throat and has developed wheezing; history of asthma.  She is a pt of [facility] and is going to try to be seen today.  Pt instructed that if she is not feeling better before her procedure date  Fri 11/15; she will need to reschedule procedure until she is better.

## 2012-08-06 ENCOUNTER — Encounter: Payer: BC Managed Care – PPO | Admitting: Gastroenterology

## 2012-10-05 ENCOUNTER — Ambulatory Visit (AMBULATORY_SURGERY_CENTER): Payer: BC Managed Care – PPO | Admitting: Gastroenterology

## 2012-10-05 ENCOUNTER — Encounter: Payer: Self-pay | Admitting: Gastroenterology

## 2012-10-05 VITALS — BP 118/72 | HR 83 | Temp 97.7°F | Resp 16 | Ht 62.0 in | Wt 200.0 lb

## 2012-10-05 DIAGNOSIS — Z1211 Encounter for screening for malignant neoplasm of colon: Secondary | ICD-10-CM

## 2012-10-05 DIAGNOSIS — Z85038 Personal history of other malignant neoplasm of large intestine: Secondary | ICD-10-CM

## 2012-10-05 MED ORDER — SODIUM CHLORIDE 0.9 % IV SOLN: 500.0000 mL | INTRAVENOUS | Status: DC

## 2012-10-05 NOTE — Patient Instructions (Addendum)
Normal colonoscopy today.  You may resume your current medications today.  Please call if any questions or concerns.  Repeat colonoscopy in 5 years.      YOU HAD AN ENDOSCOPIC PROCEDURE TODAY AT THE [facility]: Refer to the procedure report that was given to you for any specific questions about what was found during the examination.  If the procedure report does not answer your questions, please call your gastroenterologist to clarify.  If you requested that your care partner not be given the details of your procedure findings, then the procedure report has been included in a sealed envelope for you to review at your convenience later.  YOU SHOULD EXPECT: Some feelings of bloating in the abdomen. Passage of more gas than usual.  Walking can help get rid of the air that was put into your GI tract during the procedure and reduce the bloating. If you had a lower endoscopy (such as a colonoscopy or flexible sigmoidoscopy) you may notice spotting of blood in your stool or on the toilet paper. If you underwent a bowel prep for your procedure, then you may not have a normal bowel movement for a few days.  DIET: Your first meal following the procedure should be a light meal and then it is ok to progress to your normal diet.  A half-sandwich or bowl of soup is an example of a good first meal.  Heavy or fried foods are harder to digest and may make you feel nauseous or bloated.  Likewise meals heavy in dairy and vegetables can cause extra gas to form and this can also increase the bloating.  Drink plenty of fluids but you should avoid alcoholic beverages for 24 hours.  ACTIVITY: Your care partner should take you home directly after the procedure.  You should plan to take it easy, moving slowly for the rest of the day.  You can resume normal activity the day after the procedure however you should NOT DRIVE or use heavy machinery for 24 hours (because of the sedation medicines used during the test).     SYMPTOMS TO REPORT IMMEDIATELY: A gastroenterologist can be reached at any hour.  During normal business hours, 8:30 AM to 5:00 PM Monday through Friday, call [phone].  After hours and on weekends, please call the GI answering service at [phone] who will take a message and have the physician on call contact you.   Following lower endoscopy (colonoscopy or flexible sigmoidoscopy):  Excessive amounts of blood in the stool  Significant tenderness or worsening of abdominal pains  Swelling of the abdomen that is new, acute  Fever of 100F or higher    FOLLOW UP: If any biopsies were taken you will be contacted by phone or by letter within the next 1-3 weeks.  Call your gastroenterologist if you have not heard about the biopsies in 3 weeks.  Our staff will call the home number listed on your records the next business day following your procedure to check on you and address any questions or concerns that you may have at that time regarding the information given to you following your procedure. This is a courtesy call and so if there is no answer at the home number and we have not heard from you through the emergency physician on call, we will assume that you have returned to your regular daily activities without incident.  SIGNATURES/CONFIDENTIALITY: You and/or your care partner have signed paperwork which will be entered into your electronic medical  record.  These signatures attest to the fact that that the information above on your After Visit Summary has been reviewed and is understood.  Full responsibility of the confidentiality of this discharge information lies with you and/or your care-partner.

## 2012-10-05 NOTE — Progress Notes (Signed)
Pt encouraged to pass flatus.  She denies having to pass flatus.  Abdomen soft.  Pt rolled to right side.  HOB dropped and feet raised.  Maw

## 2012-10-05 NOTE — Op Note (Signed)
[facility] [address] [place], [address]   COLONOSCOPY PROCEDURE REPORT  PATIENT: Jane [name], Jane [name]  MR#: [id number] BIRTHDATE: Mar 18, 1951 , 62  yrs. old GENDER: Female ENDOSCOPIST: Meryl Dare, MD, [facility] PROCEDURE DATE:  10/05/2012 PROCEDURE:   Colonoscopy, diagnostic ASA CLASS:   Class II INDICATIONS:High risk patient with personal history of rectal cancer, 2004. MEDICATIONS: MAC sedation, administered by CRNA and propofol (Diprivan) 150mg  IV DESCRIPTION OF PROCEDURE:   After the risks benefits and alternatives of the procedure were thoroughly explained, informed consent was obtained.  A digital rectal exam revealed no abnormalities of the rectum.   The LB CF-[id number]  endoscope was introduced through the anus and advanced to the cecum, which was identified by both the appendix and ileocecal valve. No adverse events experienced.   The quality of the prep was excellent, using MoviPrep  The instrument was then slowly withdrawn as the colon was fully examined.  COLON FINDINGS: There was evidence of a prior low anterior surgical anastomosis in the rectum.   The colon was otherwise normal.  There was no diverticulosis, inflammation, polyps or cancers unless previously stated.  Retroflexed views revealed no abnormalities. The time to cecum=2 minutes 49 seconds.  Withdrawal time=7 minutes 13 seconds.  The scope was withdrawn and the procedure completed.  COMPLICATIONS: There were no complications.  ENDOSCOPIC IMPRESSION: 1.   There was evidence of a prior low anterior surgical anastomosis in the rectum 2.   The colon was otherwise normal  RECOMMENDATIONS: 1.  Repeat Colonoscopy in 5 years.   eSigned:  Meryl Dare, MD, [facility] 10/05/2012 10:17 AM   cc: Holley Bouche, MD

## 2012-10-05 NOTE — Progress Notes (Signed)
No egg or soy allergy. ewm 

## 2012-10-05 NOTE — Progress Notes (Signed)
No complaints noted in the recovery room. Maw  Pt has not passed any gas in the bed.  Her abdomen was soft and pt denies pain.  I asked her to go to the restroom after being on the monitor 30 mins and try to expell flatus. Maw

## 2012-10-05 NOTE — Progress Notes (Signed)
Per the pt she did pass a good amount of gas in the restroom.  Abdomen still soft and she denies pain.  Pt ready for discharge.  I assisted her in dressing. Maw  Patient did not experience any of the following events: a burn prior to discharge; a fall within the facility; wrong site/side/patient/procedure/implant event; or a hospital transfer or hospital admission upon discharge from the facility. [phone]) Patient did not have preoperative order for IV antibiotic SSI prophylaxis. [phone])

## 2012-10-06 ENCOUNTER — Telehealth: Payer: Self-pay | Admitting: *Deleted

## 2012-10-06 NOTE — Telephone Encounter (Signed)
Message left

## 2012-10-14 ENCOUNTER — Ambulatory Visit: Payer: BC Managed Care – PPO | Admitting: Pulmonary Disease

## 2012-11-12 ENCOUNTER — Ambulatory Visit: Payer: BC Managed Care – PPO | Admitting: Pulmonary Disease

## 2013-06-17 ENCOUNTER — Other Ambulatory Visit: Payer: Self-pay | Admitting: Internal Medicine

## 2013-06-17 DIAGNOSIS — C20 Malignant neoplasm of rectum: Secondary | ICD-10-CM

## 2013-06-20 ENCOUNTER — Other Ambulatory Visit: Payer: BC Managed Care – PPO | Admitting: Lab

## 2013-06-20 ENCOUNTER — Ambulatory Visit: Payer: BC Managed Care – PPO

## 2013-10-13 ENCOUNTER — Other Ambulatory Visit: Payer: Self-pay

## 2013-10-13 DIAGNOSIS — Z1231 Encounter for screening mammogram for malignant neoplasm of breast: Secondary | ICD-10-CM

## 2013-10-27 ENCOUNTER — Telehealth: Payer: Self-pay | Admitting: *Deleted

## 2013-10-27 NOTE — Telephone Encounter (Signed)
Pt called for an appt. gv appt for 11/14/13 w/ labs@ 9am and ov@ 9:30am. Pt is aware...td

## 2013-11-09 ENCOUNTER — Inpatient Hospital Stay: Admission: RE | Admit: 2013-11-09 | Payer: BC Managed Care – PPO | Source: Ambulatory Visit

## 2013-11-14 ENCOUNTER — Telehealth: Payer: Self-pay | Admitting: Internal Medicine

## 2013-11-14 ENCOUNTER — Encounter: Payer: Self-pay | Admitting: Internal Medicine

## 2013-11-14 ENCOUNTER — Ambulatory Visit (HOSPITAL_BASED_OUTPATIENT_CLINIC_OR_DEPARTMENT_OTHER): Payer: BC Managed Care – PPO | Admitting: Internal Medicine

## 2013-11-14 ENCOUNTER — Other Ambulatory Visit (HOSPITAL_BASED_OUTPATIENT_CLINIC_OR_DEPARTMENT_OTHER): Payer: BC Managed Care – PPO

## 2013-11-14 VITALS — BP 137/76 | HR 95 | Temp 98.0°F | Resp 20 | Ht 62.0 in | Wt 191.8 lb

## 2013-11-14 DIAGNOSIS — C20 Malignant neoplasm of rectum: Secondary | ICD-10-CM

## 2013-11-14 LAB — COMPREHENSIVE METABOLIC PANEL (CC13)
ALBUMIN: 4.3 g/dL (ref 3.5–5.0)
ALK PHOS: 99 U/L (ref 40–150)
ALT: 24 U/L (ref 0–55)
AST: 24 U/L (ref 5–34)
Anion Gap: 10 mEq/L (ref 3–11)
BUN: 18.6 mg/dL (ref 7.0–26.0)
CALCIUM: 10.2 mg/dL (ref 8.4–10.4)
CHLORIDE: 104 meq/L (ref 98–109)
CO2: 28 mEq/L (ref 22–29)
Creatinine: 0.8 mg/dL (ref 0.6–1.1)
Glucose: 129 mg/dl (ref 70–140)
POTASSIUM: 4.2 meq/L (ref 3.5–5.1)
SODIUM: 142 meq/L (ref 136–145)
TOTAL PROTEIN: 7.9 g/dL (ref 6.4–8.3)
Total Bilirubin: 0.69 mg/dL (ref 0.20–1.20)

## 2013-11-14 LAB — CBC WITH DIFFERENTIAL/PLATELET
BASO%: 0.8 % (ref 0.0–2.0)
BASOS ABS: 0.1 10*3/uL (ref 0.0–0.1)
EOS ABS: 0.1 10*3/uL (ref 0.0–0.5)
EOS%: 1.2 % (ref 0.0–7.0)
HEMATOCRIT: 41.1 % (ref 34.8–46.6)
HGB: 13.1 g/dL (ref 11.6–15.9)
LYMPH%: 25.8 % (ref 14.0–49.7)
MCH: 27.7 pg (ref 25.1–34.0)
MCHC: 32 g/dL (ref 31.5–36.0)
MCV: 86.6 fL (ref 79.5–101.0)
MONO#: 0.4 10*3/uL (ref 0.1–0.9)
MONO%: 6.1 % (ref 0.0–14.0)
NEUT#: 4.4 10*3/uL (ref 1.5–6.5)
NEUT%: 66.1 % (ref 38.4–76.8)
PLATELETS: 248 10*3/uL (ref 145–400)
RBC: 4.74 10*6/uL (ref 3.70–5.45)
RDW: 13 % (ref 11.2–14.5)
WBC: 6.6 10*3/uL (ref 3.9–10.3)
lymph#: 1.7 10*3/uL (ref 0.9–3.3)

## 2013-11-14 LAB — LACTATE DEHYDROGENASE (CC13): LDH: 168 U/L (ref 125–245)

## 2013-11-14 NOTE — Telephone Encounter (Signed)
gv adn printed appt sched and avs for pt for Feb 2016 °

## 2013-11-14 NOTE — Progress Notes (Signed)
[place], MD [address], [address] [place] [address]  DIAGNOSIS: Rectal cancer - Plan: CBC with Differential, Comprehensive metabolic panel (Cmet) - [place]  Chief Complaint  Patient presents with  . Rectal Cancer    CURRENT TREATMENT: Observation.     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   12/01/2002 Pathology Tumor stage was T2NO, stage I.   12/01/2002 Surgery Lower anterior resection   12/01/2002 Initial Diagnosis Rectal cancer, Stage I.   11/06/2003 Imaging CT C/A/P. No evidence of recurrent or metastatic disease in the chest, abdomen, pelvis.   06/14/2010 Tumor Marker CEA was 2.3   10/05/2012 Procedure Colonoscopy (Dr. Silvio Pate).  No evidence of malignancy.  Post surgery changes o/w normal colon.    INTERVAL HISTORY: Jane [name] 63 y.o. female with a history Stage I Rectal Cancer is here for follow-up.  She was last seen by Dr. Ralene Ok on 07/04/2012.  Today, she reports feeling well overall.  As noted, she had a colonoscopy by Dr. Silvio Pate early last year that was negative for malignancy.   She denies any bowel changes.  She had diarrhea about one week and a half ago that remitted spontaneously.  She last saw her PCP (Dr. Kenton Kingfisher) and was started on Wellbutrin.  She complains of knee arthritis.  She is scheduled for a mammogram on December 08, 2013.   MEDICAL HISTORY: Past Medical History  Diagnosis Date  . Asthma   . Allergic rhinitis   . Hypertension   . Depression   . GERD (gastroesophageal reflux disease)   . Rectal cancer     Dx 2004  . COPD (chronic obstructive pulmonary disease)     INTERIM HISTORY: has HYPERTENSION; ASTHMA; ESOPHAGEAL REFLUX; Allergic rhinitis; and Rectal cancer on her problem list.    ALLERGIES:  is allergic to morphine.  MEDICATIONS: has a current medication list which includes the following prescription(s): albuterol,  amlodipine, bupropion, hydrocodone-acetaminophen, meloxicam, multivitamin, naproxen sodium, NON FORMULARY, paroxetine, and xopenex hfa.  SURGICAL HISTORY:  Past Surgical History  Procedure Laterality Date  . Low anterior bowel resection  2004    For rectal cancer  . Appendectomy  2004  . Colonoscopy      REVIEW OF SYSTEMS:   Constitutional: Denies fevers, chills or abnormal weight loss Eyes: Denies blurriness of vision Ears, nose, mouth, throat, and face: Denies mucositis or sore throat Respiratory: Denies cough, dyspnea or wheezes Cardiovascular: Denies palpitation, chest discomfort or lower extremity swelling Gastrointestinal:  Denies nausea, heartburn or change in bowel habits Skin: Denies abnormal skin rashes Lymphatics: Denies new lymphadenopathy or easy bruising Neurological:Denies numbness, tingling or new weaknesses Behavioral/Psych: Mood is stable, no new changes  All other systems were reviewed with the patient and are negative.  PHYSICAL EXAMINATION: ECOG PERFORMANCE STATUS: 0 - Asymptomatic  Blood pressure 137/76, pulse 95, temperature 98 F (36.7 C), temperature source Oral, resp. rate 20, height 5\' 2"  (1.575 m), weight 191 lb 12.8 oz (87 kg), SpO2 100.00%.  GENERAL:alert, no distress and comfortable; well developed and well nourished.  SKIN: skin color, texture, turgor are normal, no rashes or significant lesions EYES: normal, Conjunctiva are pink and non-injected, sclera clear OROPHARYNX:no exudate, no erythema and lips, buccal mucosa, and tongue normal  NECK: supple, thyroid normal size, non-tender, without nodularity LYMPH:  no palpable lymphadenopathy in the cervical, axillary or supraclavicular LUNGS: clear to auscultation with normal breathing effort, no wheezes or rhonchi  HEART: regular rate & Jane and no murmurs and no lower extremity edema ABDOMEN:abdomen soft, non-tender and normal bowel sounds Musculoskeletal:no cyanosis of digits and no clubbing   NEURO: alert & oriented x 3 with fluent speech, no focal motor/sensory deficits  Labs:  Lab Results  Component Value Date   WBC 6.6 11/14/2013   HGB 13.1 11/14/2013   HCT 41.1 11/14/2013   MCV 86.6 11/14/2013   PLT 248 11/14/2013   NEUTROABS 4.4 11/14/2013      Chemistry      Component Value Date/Time   NA 143 06/24/2012 1359   NA 140 06/23/2011 0900   K 4.1 06/24/2012 1359   K 4.0 06/23/2011 0900   CL 106 06/24/2012 1359   CL 103 06/23/2011 0900   CO2 26 06/24/2012 1359   CO2 28 06/23/2011 0900   BUN 16.0 06/24/2012 1359   BUN 17 06/23/2011 0900   CREATININE 0.8 06/24/2012 1359   CREATININE 0.74 06/23/2011 0900      Component Value Date/Time   CALCIUM 9.6 06/24/2012 1359   CALCIUM 9.1 06/23/2011 0900   ALKPHOS 93 06/24/2012 1359   ALKPHOS 80 06/23/2011 0900   AST 29 06/24/2012 1359   AST 20 06/23/2011 0900   ALT 32 06/24/2012 1359   ALT 17 06/23/2011 0900   BILITOT 0.80 06/24/2012 1359   BILITOT 0.5 06/23/2011 0900      CBC:  Recent Labs Lab 11/14/13 0912  WBC 6.6  NEUTROABS 4.4  HGB 13.1  HCT 41.1  MCV 86.6  PLT 248     Studies:  No results found.   RADIOGRAPHIC STUDIES: No results found.  ASSESSMENT: Jane [name] 63 y.o. female with a history of Rectal cancer - Plan: CBC with Differential, Comprehensive metabolic panel (Cmet) - [facility]   PLAN:   1. Rectal cancer, Stage I (T2N0). --She continues to do well.  She is now out nearly 11 years from the tim e of her diagnosis without evidence of recurrence.  She has had a colonoscopy by Dr. Silvio Pate early last year.  I again instructed her that she did not have to come back for follow-up, however, she would prefer to see [place] on an annual basis.   2. Follow-up. --we will plan to see her in one year at which time we will check her CBC and chemistries.   All questions were answered. The patient knows to call the clinic with any problems, questions or concerns. We can certainly see the patient much sooner if necessary.  I  spent 10 minutes counseling the patient face to face. The total time spent in the appointment was 15 minutes.    Orlandis Sanden, MD 11/14/2013 10:35 AM

## 2014-10-17 ENCOUNTER — Telehealth: Payer: Self-pay | Admitting: Hematology

## 2014-10-17 NOTE — Telephone Encounter (Signed)
, °

## 2014-11-14 ENCOUNTER — Other Ambulatory Visit: Payer: BC Managed Care – PPO

## 2014-11-14 ENCOUNTER — Ambulatory Visit: Payer: BC Managed Care – PPO

## 2014-11-21 ENCOUNTER — Telehealth: Payer: Self-pay | Admitting: Hematology

## 2014-11-21 ENCOUNTER — Ambulatory Visit: Payer: BC Managed Care – PPO | Admitting: Hematology

## 2014-11-21 ENCOUNTER — Other Ambulatory Visit: Payer: BC Managed Care – PPO

## 2014-11-21 NOTE — Telephone Encounter (Signed)
Pt called in and lm to r/s todays appt,lm with new appt  anne

## 2014-11-27 ENCOUNTER — Telehealth: Payer: Self-pay | Admitting: Hematology

## 2014-11-27 NOTE — Telephone Encounter (Signed)
Left message to confirm appointment changed to 03/18. Mailed calendar.

## 2014-12-07 ENCOUNTER — Ambulatory Visit: Payer: BC Managed Care – PPO | Admitting: Hematology

## 2014-12-07 ENCOUNTER — Other Ambulatory Visit: Payer: BC Managed Care – PPO

## 2014-12-07 ENCOUNTER — Other Ambulatory Visit: Payer: Self-pay | Admitting: *Deleted

## 2014-12-07 DIAGNOSIS — C20 Malignant neoplasm of rectum: Secondary | ICD-10-CM

## 2014-12-08 ENCOUNTER — Telehealth: Payer: Self-pay | Admitting: Hematology

## 2014-12-08 ENCOUNTER — Other Ambulatory Visit (HOSPITAL_BASED_OUTPATIENT_CLINIC_OR_DEPARTMENT_OTHER): Payer: BC Managed Care – PPO

## 2014-12-08 ENCOUNTER — Encounter: Payer: Self-pay | Admitting: Hematology

## 2014-12-08 ENCOUNTER — Ambulatory Visit (HOSPITAL_BASED_OUTPATIENT_CLINIC_OR_DEPARTMENT_OTHER): Payer: BC Managed Care – PPO | Admitting: Hematology

## 2014-12-08 VITALS — BP 124/71 | HR 97 | Temp 98.1°F | Resp 18 | Ht 62.0 in | Wt 198.8 lb

## 2014-12-08 DIAGNOSIS — Z85048 Personal history of other malignant neoplasm of rectum, rectosigmoid junction, and anus: Secondary | ICD-10-CM

## 2014-12-08 DIAGNOSIS — C20 Malignant neoplasm of rectum: Secondary | ICD-10-CM

## 2014-12-08 LAB — COMPREHENSIVE METABOLIC PANEL (CC13)
ALBUMIN: 4 g/dL (ref 3.5–5.0)
ALK PHOS: 90 U/L (ref 40–150)
ALT: 32 U/L (ref 0–55)
ANION GAP: 7 meq/L (ref 3–11)
AST: 27 U/L (ref 5–34)
BUN: 18 mg/dL (ref 7.0–26.0)
CO2: 28 mEq/L (ref 22–29)
Calcium: 9.3 mg/dL (ref 8.4–10.4)
Chloride: 104 mEq/L (ref 98–109)
Creatinine: 0.8 mg/dL (ref 0.6–1.1)
EGFR: 89 mL/min/{1.73_m2} — ABNORMAL LOW (ref >90)
GLUCOSE: 130 mg/dL (ref 70–140)
POTASSIUM: 4.9 meq/L (ref 3.5–5.1)
Sodium: 140 mEq/L (ref 136–145)
TOTAL PROTEIN: 7.5 g/dL (ref 6.4–8.3)
Total Bilirubin: 0.44 mg/dL (ref 0.20–1.20)

## 2014-12-08 LAB — CBC WITH DIFFERENTIAL/PLATELET
BASO%: 0.5 % (ref 0.0–2.0)
Basophils Absolute: 0 10*3/uL (ref 0.0–0.1)
EOS ABS: 0.2 10*3/uL (ref 0.0–0.5)
EOS%: 2.7 % (ref 0.0–7.0)
HCT: 40.4 % (ref 34.8–46.6)
HGB: 12.6 g/dL (ref 11.6–15.9)
LYMPH%: 28.8 % (ref 14.0–49.7)
MCH: 27.5 pg (ref 25.1–34.0)
MCHC: 31.2 g/dL — AB (ref 31.5–36.0)
MCV: 88.2 fL (ref 79.5–101.0)
MONO#: 0.5 10*3/uL (ref 0.1–0.9)
MONO%: 9.2 % (ref 0.0–14.0)
NEUT%: 58.8 % (ref 38.4–76.8)
NEUTROS ABS: 3.3 10*3/uL (ref 1.5–6.5)
Platelets: 238 10*3/uL (ref 145–400)
RBC: 4.58 10*6/uL (ref 3.70–5.45)
RDW: 13.1 % (ref 11.2–14.5)
WBC: 5.6 10*3/uL (ref 3.9–10.3)
lymph#: 1.6 10*3/uL (ref 0.9–3.3)

## 2014-12-08 NOTE — Progress Notes (Signed)
[place], [place] [address]  DIAGNOSIS: No diagnosis found.  Chief Complaint  Patient presents with  . Follow-up    colon ca    CURRENT TREATMENT: Observation.     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   12/01/2002 Pathology Results Tumor stage was T2NO, stage I.   12/01/2002 Surgery Lower anterior resection   12/01/2002 Initial Diagnosis Rectal cancer, Stage I.   11/06/2003 Imaging CT C/Jane/P. No evidence of recurrent or metastatic disease in the chest, abdomen, pelvis.   06/14/2010 Tumor Marker CEA was 2.3   10/05/2012 Procedure Colonoscopy (Dr. Silvio Pate).  No evidence of malignancy.  Post surgery changes o/w normal colon.    INTERVAL HISTORY: Jane [name] 64 y.o. female with Jane history Stage I Rectal Cancer is here for follow-up.  She feels well overall, she has chronic knee pain, intermittent, no change lately. No other pain, nausea, abdominla discomfort. She had some GI flu Jane few weeks ago and she recovered well. She is scheduled for Jane mammogram on December 08, 2013.   Last colonoscopy in 09/2012 which was negative, next due in 09/2017.   MEDICAL HISTORY: Past Medical History  Diagnosis Date  . Asthma   . Allergic rhinitis   . Hypertension   . Depression   . GERD (gastroesophageal reflux disease)   . Rectal cancer     Dx 2004  . COPD (chronic obstructive pulmonary disease)     INTERIM HISTORY: has HYPERTENSION; ASTHMA; ESOPHAGEAL REFLUX; Allergic rhinitis; and Rectal cancer on her problem list.    ALLERGIES:  is allergic to morphine.  MEDICATIONS: has Jane current medication list which includes the following prescription(s): albuterol, bupropion, meloxicam, multivitamin, NON FORMULARY, paroxetine, xopenex hfa, and hydrocodone-acetaminophen.  SURGICAL HISTORY:  Past Surgical History  Procedure Laterality Date  . Low  anterior bowel resection  2004    For rectal cancer  . Appendectomy  2004  . Colonoscopy      REVIEW OF SYSTEMS:   Constitutional: Denies fevers, chills or abnormal weight loss Eyes: Denies blurriness of vision Ears, nose, mouth, throat, and face: Denies mucositis or sore throat Respiratory: Denies cough, dyspnea or wheezes Cardiovascular: Denies palpitation, chest discomfort or lower extremity swelling Gastrointestinal:  Denies nausea, heartburn or change in bowel habits Skin: Denies abnormal skin rashes Lymphatics: Denies new lymphadenopathy or easy bruising Neurological:Denies numbness, tingling or new weaknesses Behavioral/Psych: Mood is stable, no new changes  All other systems were reviewed with the patient and are negative.  PHYSICAL EXAMINATION: ECOG PERFORMANCE STATUS: 0 - Asymptomatic  Blood pressure 124/71, pulse 97, temperature 98.1 F (36.7 C), temperature source Oral, resp. rate 18, height 5\' 2"  (1.575 m), weight 198 lb 12.8 oz (90.175 kg).  GENERAL:alert, no distress and comfortable; well developed and well nourished.  SKIN: skin color, texture, turgor are normal, no rashes or significant lesions EYES: normal, Conjunctiva are pink and non-injected, sclera clear OROPHARYNX:no exudate, no erythema and lips, buccal mucosa, and tongue normal  NECK: supple, thyroid normal size, non-tender, without nodularity LYMPH:  no palpable lymphadenopathy in the cervical, axillary, supraclavicular or bilateral inguinal area LUNGS: clear to auscultation with normal breathing effort, no wheezes or rhonchi HEART: regular rate & rhythm and no murmurs and no lower extremity edema ABDOMEN:abdomen soft, non-tender and normal bowel sounds.  Musculoskeletal:no cyanosis of digits and no clubbing  NEURO: alert & oriented x 3 with fluent  speech, no focal motor/sensory deficits  Labs:  CBC Latest Ref Rng 12/08/2014 11/14/2013 06/24/2012  WBC 3.9 - 10.3 10e3/uL 5.6 6.6 5.0  Hemoglobin 11.6 -  15.9 g/dL 12.6 13.1 12.8  Hematocrit 34.8 - 46.6 % 40.4 41.1 39.1  Platelets 145 - 400 10e3/uL 238 248 194    CMP Latest Ref Rng 12/08/2014 11/14/2013 06/24/2012  Glucose 70 - 140 mg/dl 130 129 133(H)  BUN 7.0 - 26.0 mg/dL 18.0 18.6 16.0  Creatinine 0.6 - 1.1 mg/dL 0.8 0.8 0.8  Sodium 136 - 145 mEq/L 140 142 143  Potassium 3.5 - 5.1 mEq/L 4.9 4.2 4.1  Chloride 98 - 107 mEq/L - - 106  CO2 22 - 29 mEq/L 28 28 26   Calcium 8.4 - 10.4 mg/dL 9.3 10.2 9.6  Total Protein 6.4 - 8.3 g/dL 7.5 7.9 7.3  Total Bilirubin 0.20 - 1.20 mg/dL 0.44 0.69 0.80  Alkaline Phos 40 - 150 U/L 90 99 93  AST 5 - 34 U/L 27 24 29   ALT 0 - 55 U/L 32 24 32       ASSESSMENT: Benjamine [name] Jane [name] 64 y.o. female with Jane history of No diagnosis found.   PLAN:   1. Rectal cancer, Stage I (T2N0). --She continues to do well.  She is now out nearly 12 years from the tim e of her diagnosis without evidence of recurrence.  She has had Jane colonoscopy by Dr. Silvio Pate 2 years ago which was negative except the surgical scar.  -She has completed the 5 years surveillance, but the prefer to come here this follow-up with [place]. -We will follow her annually, per her reference.    2. Cancer screening -She is scheduled to have him MRI next month.  3. Hypertension and arthritis -She will continue follow-up with her primary care physician  Follow-up I'll see her back in 1 year with lab and physical exam.  All questions were answered. The patient knows to call the clinic with any problems, questions or concerns. We can certainly see the patient much sooner if necessary.  I spent 10 minutes counseling the patient face to face. The total time spent in the appointment was 15 minutes.    Truitt Merle, MD 12/08/2014 9:39 AM

## 2014-12-08 NOTE — Telephone Encounter (Signed)
gave and printed appt sched and avs for pt for march 2017 °

## 2015-11-30 ENCOUNTER — Telehealth: Payer: Self-pay | Admitting: Hematology

## 2015-11-30 NOTE — Telephone Encounter (Signed)
Lvm advising appt chg from 3/17 @ 9am to 3/17 @ 12.45pm.

## 2015-12-06 ENCOUNTER — Telehealth: Payer: Self-pay | Admitting: Hematology

## 2015-12-06 NOTE — Telephone Encounter (Signed)
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 Pt confirmed labs/ov... KJ

## 2015-12-07 ENCOUNTER — Ambulatory Visit: Payer: BC Managed Care – PPO | Admitting: Hematology

## 2015-12-07 ENCOUNTER — Other Ambulatory Visit: Payer: BC Managed Care – PPO

## 2015-12-14 ENCOUNTER — Other Ambulatory Visit: Payer: Self-pay | Admitting: *Deleted

## 2015-12-14 DIAGNOSIS — C20 Malignant neoplasm of rectum: Secondary | ICD-10-CM

## 2015-12-17 ENCOUNTER — Telehealth: Payer: Self-pay | Admitting: Hematology

## 2015-12-17 ENCOUNTER — Encounter: Payer: Self-pay | Admitting: Hematology

## 2015-12-17 ENCOUNTER — Other Ambulatory Visit (HOSPITAL_BASED_OUTPATIENT_CLINIC_OR_DEPARTMENT_OTHER): Payer: Medicare Other

## 2015-12-17 ENCOUNTER — Ambulatory Visit (HOSPITAL_BASED_OUTPATIENT_CLINIC_OR_DEPARTMENT_OTHER): Payer: Medicare Other | Admitting: Hematology

## 2015-12-17 VITALS — BP 148/77 | HR 99 | Temp 98.5°F | Resp 18 | Ht 62.0 in | Wt 198.6 lb

## 2015-12-17 DIAGNOSIS — I10 Essential (primary) hypertension: Secondary | ICD-10-CM

## 2015-12-17 DIAGNOSIS — Z85048 Personal history of other malignant neoplasm of rectum, rectosigmoid junction, and anus: Secondary | ICD-10-CM

## 2015-12-17 DIAGNOSIS — M1990 Unspecified osteoarthritis, unspecified site: Secondary | ICD-10-CM | POA: Diagnosis not present

## 2015-12-17 DIAGNOSIS — C20 Malignant neoplasm of rectum: Secondary | ICD-10-CM

## 2015-12-17 DIAGNOSIS — Z780 Asymptomatic menopausal state: Secondary | ICD-10-CM

## 2015-12-17 LAB — COMPREHENSIVE METABOLIC PANEL
ALT: 36 U/L (ref 0–55)
AST: 31 U/L (ref 5–34)
Albumin: 4 g/dL (ref 3.5–5.0)
Alkaline Phosphatase: 80 U/L (ref 40–150)
Anion Gap: 8 mEq/L (ref 3–11)
BUN: 21.3 mg/dL (ref 7.0–26.0)
CHLORIDE: 104 meq/L (ref 98–109)
CO2: 28 meq/L (ref 22–29)
Calcium: 9.1 mg/dL (ref 8.4–10.4)
Creatinine: 0.9 mg/dL (ref 0.6–1.1)
EGFR: 83 mL/min/{1.73_m2} — ABNORMAL LOW (ref >90)
GLUCOSE: 135 mg/dL (ref 70–140)
POTASSIUM: 4.3 meq/L (ref 3.5–5.1)
SODIUM: 141 meq/L (ref 136–145)
Total Bilirubin: 0.57 mg/dL (ref 0.20–1.20)
Total Protein: 7.3 g/dL (ref 6.4–8.3)

## 2015-12-17 LAB — CBC WITH DIFFERENTIAL/PLATELET
BASO%: 0.9 % (ref 0.0–2.0)
BASOS ABS: 0 10*3/uL (ref 0.0–0.1)
EOS%: 1.9 % (ref 0.0–7.0)
Eosinophils Absolute: 0.1 10*3/uL (ref 0.0–0.5)
HEMATOCRIT: 40.6 % (ref 34.8–46.6)
HEMOGLOBIN: 13 g/dL (ref 11.6–15.9)
LYMPH#: 1.5 10*3/uL (ref 0.9–3.3)
LYMPH%: 29.8 % (ref 14.0–49.7)
MCH: 27.6 pg (ref 25.1–34.0)
MCHC: 32 g/dL (ref 31.5–36.0)
MCV: 86 fL (ref 79.5–101.0)
MONO#: 0.4 10*3/uL (ref 0.1–0.9)
MONO%: 7.3 % (ref 0.0–14.0)
NEUT#: 3.1 10*3/uL (ref 1.5–6.5)
NEUT%: 60.1 % (ref 38.4–76.8)
PLATELETS: 216 10*3/uL (ref 145–400)
RBC: 4.72 10*6/uL (ref 3.70–5.45)
RDW: 13.1 % (ref 11.2–14.5)
WBC: 5.1 10*3/uL (ref 3.9–10.3)

## 2015-12-17 NOTE — Telephone Encounter (Signed)
Gave and printed appt sched and avs for pt for March 2018

## 2015-12-17 NOTE — Progress Notes (Signed)
[place], MD [address] [place] [address]  DIAGNOSIS: Rectal cancer [facility]) - Plan: CBC with Differential, Comprehensive metabolic panel  No chief complaint on file.   CURRENT TREATMENT: Observation.     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   12/01/2002 Pathology Results Tumor stage was T2NO, stage I.   12/01/2002 Surgery Lower anterior resection   12/01/2002 Initial Diagnosis Rectal cancer, Stage I.   11/06/2003 Imaging CT C/A/P. No evidence of recurrent or metastatic disease in the chest, abdomen, pelvis.   06/14/2010 Tumor Marker CEA was 2.3   10/05/2012 Procedure Colonoscopy (Dr. Silvio Pate).  No evidence of malignancy.  Post surgery changes o/w normal colon.    INTERVAL HISTORY: Jane [name] 65 y.o. female with a history Stage I Rectal Cancer is here for follow-up.  She was last seen by me 1 year ago. She is doing well, deneis any significant medical event in the last year, no hospitalization or in the visit. She denies any pain, nausea, abdominal discomfort or change of her bowel habits. She has good appetite and well. She takes care of her 17-year-old grandson.   Last colonoscopy in 09/2012 which was negative, next due in 09/2017.   MEDICAL HISTORY: Past Medical History  Diagnosis Date  . Asthma   . Allergic rhinitis   . Hypertension   . Depression   . GERD (gastroesophageal reflux disease)   . Rectal cancer ([place])     Dx 2004  . COPD (chronic obstructive pulmonary disease) (H[facility])     INTERIM HISTORY: has HYPERTENSION; ASTHMA; ESOPHAGEAL REFLUX; Allergic rhinitis; and Rectal cancer ([place]) on her problem list.    ALLERGIES:  is allergic to morphine.  MEDICATIONS: has a current medication list which includes the following prescription(s): advair diskus, albuterol, bupropion, lidocaine, meloxicam, multivitamin, NON FORMULARY,  paroxetine, and xopenex hfa.  SURGICAL HISTORY:  Past Surgical History  Procedure Laterality Date  . Low anterior bowel resection  2004    For rectal cancer  . Appendectomy  2004  . Colonoscopy      REVIEW OF SYSTEMS:   Constitutional: Denies fevers, chills or abnormal weight loss Eyes: Denies blurriness of vision Ears, nose, mouth, throat, and face: Denies mucositis or sore throat Respiratory: Denies cough, dyspnea or wheezes Cardiovascular: Denies palpitation, chest discomfort or lower extremity swelling Gastrointestinal:  Denies nausea, heartburn or change in bowel habits Skin: Denies abnormal skin rashes Lymphatics: Denies new lymphadenopathy or easy bruising Neurological:Denies numbness, tingling or new weaknesses Behavioral/Psych: Mood is stable, no new changes  All other systems were reviewed with the patient and are negative.  PHYSICAL EXAMINATION: ECOG PERFORMANCE STATUS: 0 - Asymptomatic  Blood pressure 148/77, pulse 99, temperature 98.5 F (36.9 C), temperature source Oral, resp. rate 18, height 5\' 2"  (1.575 m), weight 198 lb 9.6 oz (90.084 kg), SpO2 100 %.  GENERAL:alert, no distress and comfortable; well developed and well nourished.  SKIN: skin color, texture, turgor are normal, no rashes or significant lesions EYES: normal, Conjunctiva are pink and non-injected, sclera clear OROPHARYNX:no exudate, no erythema and lips, buccal mucosa, and tongue normal  NECK: supple, thyroid normal size, non-tender, without nodularity LYMPH:  no palpable lymphadenopathy in the cervical, axillary, supraclavicular or bilateral inguinal area LUNGS: clear to auscultation with normal breathing effort, no wheezes or rhonchi HEART: regular rate & rhythm and no murmurs and no lower extremity edema ABDOMEN:abdomen soft, non-tender and normal  bowel sounds.  Musculoskeletal:no cyanosis of digits and no clubbing  NEURO: alert & oriented x 3 with fluent speech, no focal motor/sensory  deficits  Labs:  CBC Latest Ref Rng 12/17/2015 12/08/2014 11/14/2013  WBC 3.9 - 10.3 10e3/uL 5.1 5.6 6.6  Hemoglobin 11.6 - 15.9 g/dL 13.0 12.6 13.1  Hematocrit 34.8 - 46.6 % 40.6 40.4 41.1  Platelets 145 - 400 10e3/uL 216 238 248    CMP Latest Ref Rng 12/17/2015 12/08/2014 11/14/2013  Glucose 70 - 140 mg/dl 135 130 129  BUN 7.0 - 26.0 mg/dL 21.3 18.0 18.6  Creatinine 0.6 - 1.1 mg/dL 0.9 0.8 0.8  Sodium 136 - 145 mEq/L 141 140 142  Potassium 3.5 - 5.1 mEq/L 4.3 4.9 4.2  Chloride 98 - 107 mEq/L - - -  CO2 22 - 29 mEq/L 28 28 28   Calcium 8.4 - 10.4 mg/dL 9.1 9.3 10.2  Total Protein 6.4 - 8.3 g/dL 7.3 7.5 7.9  Total Bilirubin 0.20 - 1.20 mg/dL 0.57 0.44 0.69  Alkaline Phos 40 - 150 U/L 80 90 99  AST 5 - 34 U/L 31 27 24   ALT 0 - 55 U/L 36 32 24     ASSESSMENT: Jane [name] 65 y.o. female with a history of Rectal cancer ([place]) - Plan: CBC with Differential, Comprehensive metabolic panel   PLAN:   1. Rectal cancer, Stage I (T2N0). --She continues to do well.  She is now out nearly 13 years from the tim e of her diagnosis without evidence of recurrence.  She has had a colonoscopy by Dr. Silvio Pate in 09/2012 which was negative except the surgical scar.  -She has completed the 5 years surveillance, but the prefer to come here this follow-up with [place]. -We will follow her annually, per her reference.   -She is due for colonoscopy in 2019  2. Cancer screening -She is overdue for mammogram, last one in 2013. -She is agreeable to have a screening mammogram in the next months. I'll order one for her -I encouraged her to follow-up with her gynecologist for Pap smear.  3. Hypertension and arthritis -She will continue follow-up with her primary care physician  4. Hyperglycemia -Her blood glucose was 135, fasting, this is in the diabetic range. She has no history of diabetes. -I encouraged her to follow-up with her primary care physician.  -I discussed diet and exercise  Follow-up  -I'll see  her back in 1 year with lab and physical exam. -mammogram asap -follow up with PCP for hyperglycemia  All questions were answered. The patient knows to call the clinic with any problems, questions or concerns. We can certainly see the patient much sooner if necessary.  I spent 15 minutes counseling the patient face to face. The total time spent in the appointment was 20 minutes.    Truitt Merle, MD 12/17/2015 9:13 AM

## 2015-12-18 ENCOUNTER — Other Ambulatory Visit: Payer: Self-pay

## 2015-12-18 DIAGNOSIS — Z1231 Encounter for screening mammogram for malignant neoplasm of breast: Secondary | ICD-10-CM

## 2016-01-08 ENCOUNTER — Ambulatory Visit: Payer: Medicare Other

## 2016-01-25 ENCOUNTER — Ambulatory Visit
Admission: RE | Admit: 2016-01-25 | Discharge: 2016-01-25 | Disposition: A | Discharge status: Home / Self Care | Payer: Medicare Other | Source: Ambulatory Visit

## 2016-01-25 DIAGNOSIS — Z1231 Encounter for screening mammogram for malignant neoplasm of breast: Secondary | ICD-10-CM

## 2016-01-31 ENCOUNTER — Ambulatory Visit: Payer: Medicare Other | Attending: Family Medicine | Admitting: Physical Therapy

## 2016-01-31 ENCOUNTER — Encounter: Payer: Self-pay | Admitting: Physical Therapy

## 2016-01-31 DIAGNOSIS — M6281 Muscle weakness (generalized): Secondary | ICD-10-CM | POA: Diagnosis present

## 2016-01-31 DIAGNOSIS — M5442 Lumbago with sciatica, left side: Secondary | ICD-10-CM | POA: Insufficient documentation

## 2016-01-31 DIAGNOSIS — M79605 Pain in left leg: Secondary | ICD-10-CM | POA: Insufficient documentation
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
## 2016-02-06 ENCOUNTER — Ambulatory Visit: Payer: Medicare Other | Admitting: Physical Therapy

## 2016-02-06 ENCOUNTER — Encounter: Payer: Self-pay | Admitting: Physical Therapy

## 2016-02-06 DIAGNOSIS — M5442 Lumbago with sciatica, left side: Secondary | ICD-10-CM

## 2016-02-06 DIAGNOSIS — M6281 Muscle weakness (generalized): Secondary | ICD-10-CM

## 2016-02-06 DIAGNOSIS — M79605 Pain in left leg: Secondary | ICD-10-CM

## 2016-02-06 NOTE — Therapy (Signed)
[place], [place], [address] Phone: [phone]   Fax:  [phone]  Physical Therapy Treatment  Patient Details  Name: Jane [name] MRN: [id number] Date of Birth: 02-Aug-1951 Referring Provider: Antony Contras, MD  Encounter Date: 02/06/2016      PT End of Session - 02/06/16 1333    Visit Number 2   Number of Visits 13   Date for PT Re-Evaluation 03/13/16   Authorization Type medicare KX after visit 15   PT Start Time 1334   PT Stop Time 1430   PT Time Calculation (min) 56 min      Past Medical History  Diagnosis Date  . Asthma   . Allergic rhinitis   . Hypertension   . Depression   . GERD (gastroesophageal reflux disease)   . Rectal cancer ([place])     Dx 2004  . COPD (chronic obstructive pulmonary disease) [facility])     Past Surgical History  Procedure Laterality Date  . Low anterior bowel resection  2004    For rectal cancer  . Appendectomy  2004  . Colonoscopy      There were no vitals filed for this visit.      Subjective Assessment - 02/06/16 1336    Subjective Feels like leg is asleep. Reports sitting on tennis ball for pain and has made her a little sore.    Currently in Pain? Yes   Pain Score 2    Pain Location --  back of leg and hip, "kind of the whole leg"   Pain Descriptors / Indicators --  asleep                         [facility] Adult PT Treatment/Exercise - 02/06/16 0001    Exercises   Exercises Knee/Hip;Lumbar   Lumbar Exercises: Stretches   Lower Trunk Rotation 60 seconds   Knee/Hip Exercises: Stretches   Passive Hamstring Stretch 2 reps;20 seconds  seated EOB   Piriformis Stretch Limitations press/pull figure 4 2x10s ea   Knee/Hip Exercises: Aerobic   Nustep 5 min L3   Knee/Hip Exercises: Standing   Hip Extension 20 reps  elbows on counter   SLS on L; 5x approx 30 sec depending on form   Gait Training heel-toe   Other Standing Knee Exercises  tandem weight shift   Knee/Hip Exercises: Supine   Straight Leg Raises 2 sets;10 reps                PT Education - 02/06/16 1739    Education provided Yes   Education Details exercise form/rationale, gait pattern   Person(s) Educated Patient   Methods Explanation;Demonstration;Tactile cues;Verbal cues;Handout   Comprehension Verbalized understanding;Returned demonstration;Verbal cues required;Tactile cues required;Need further instruction          PT Short Term Goals - 01/31/16 1208    PT SHORT TERM GOAL #1   Title able to stand from chair pain <3/10 by 6/2   Time 3   Period Weeks   Status New   PT SHORT TERM GOAL #2   Title verbalized improvement in ability to sit comfortably.    Time 3   Period Weeks   Status New   PT SHORT TERM GOAL #3   Title Radicular pain remaining proximal to hip   Time 3   Period Weeks   Status New           PT Long Term Goals - 01/31/16  [place] #1   Title FOTO to 44% disability by 6/23   Time 6   Period Weeks   Status New   PT LONG TERM GOAL #2   Title able to stand from chair without pain   Time 6   Period Weeks   Status New   PT LONG TERM GOAL #3   Title pt will be able to cook pain <2/10 upon completion   Time 6   Period Weeks   Status New   PT LONG TERM GOAL #4   Title Will be able to sit in a chair without sensation of RLE going to sleep.    Time 6   Period Weeks   Status New               Plan - 02/06/16 1740    Clinical Impression Statement Focus today on strenghtening for core and hips to provide support during gait. Flexed trunk and trendelenburg, which pt reports is habit, places excessive strain on hip musculature creating pain. Difficulty with long term weight bearing exercises due to knee pain.    PT Treatment/Interventions ADLs/Self Care Home Management;Electrical Stimulation;Cryotherapy;Ultrasound;Traction;Moist Heat;Iontophoresis 4mg /ml Dexamethasone;Gait training;Stair  training;Functional mobility training;Therapeutic activities;Therapeutic exercise;Balance training;Patient/family education;Neuromuscular re-education;Manual techniques;Dry needling;Passive range of motion   PT Next Visit Plan gait pattern, hip extensor and ER strengthening, LE stretching   PT Home Exercise Plan tennis ball trigger point release, sitting upright in more solid chair   Consulted and Agree with Plan of Care Patient      Patient will benefit from skilled therapeutic intervention in order to improve the following deficits and impairments:  Pain, Difficulty walking, Decreased strength, Increased muscle spasms  Visit Diagnosis: Bilateral low back pain with left-sided sciatica  Muscle weakness (generalized)  Pain In Left Leg     Problem List Patient Active Problem List   Diagnosis Date Noted  . Rectal cancer ([place]) 06/24/2012  . Allergic rhinitis 01/14/2011  . ASTHMA 11/02/2009  . HYPERTENSION 10/17/2009  . ESOPHAGEAL REFLUX 11/09/2008   Kalanie Fewell C. Jera Headings PT, DPT 02/06/2016 5:42 PM   [facility] [address] [place], [place], [address] Phone: [phone]   Fax:  [phone]  Name: Jane [name] MRN: [id number] Date of Birth: 1951/07/15

## 2016-02-13 ENCOUNTER — Ambulatory Visit: Payer: Medicare Other | Admitting: Physical Therapy

## 2016-02-13 ENCOUNTER — Encounter: Payer: Self-pay | Admitting: Physical Therapy

## 2016-02-13 DIAGNOSIS — M5442 Lumbago with sciatica, left side: Secondary | ICD-10-CM | POA: Diagnosis not present

## 2016-02-13 DIAGNOSIS — M6281 Muscle weakness (generalized): Secondary | ICD-10-CM

## 2016-02-13 DIAGNOSIS — M79605 Pain in left leg: Secondary | ICD-10-CM

## 2016-02-13 NOTE — Therapy (Signed)
[place], [place], [address] Phone: [phone]   Fax:  [phone]  Physical Therapy Treatment  Patient Details  Name: Jane [name] MRN: [id number] Date of Birth: 12-06-1950 Referring Provider: Antony Contras, MD  Encounter Date: 02/13/2016      PT End of Session - 02/13/16 0808    Visit Number 3   Number of Visits 13   Date for PT Re-Evaluation 03/13/16   Authorization Type medicare KX after visit 15   PT Start Time 0802   PT Stop Time 0844   PT Time Calculation (min) 42 min   Activity Tolerance Patient tolerated treatment well   Behavior During Therapy [facility] for tasks assessed/performed      Past Medical History  Diagnosis Date  . Asthma   . Allergic rhinitis   . Hypertension   . Depression   . GERD (gastroesophageal reflux disease)   . Rectal cancer ([place])     Dx 2004  . COPD (chronic obstructive pulmonary disease) [facility])     Past Surgical History  Procedure Laterality Date  . Low anterior bowel resection  2004    For rectal cancer  . Appendectomy  2004  . Colonoscopy      There were no vitals filed for this visit.      Subjective Assessment - 02/13/16 0805    Subjective Reports L leg began bothering her on Monday. When standing from a chair, gets the sciatic pain and falling asleep sensation. Most pain notable in L knee today.    Limitations Standing   Patient Stated Goals sit to stand without pain, cooking   Currently in Pain? Yes   Pain Score 3    Pain Location Knee   Pain Orientation Left   Pain Descriptors / Indicators Sore                         [facility] Adult PT Treatment/Exercise - 02/13/16 0001    Lumbar Exercises: Supine   AB Set Limitations with UE YTB horiz ABD   Glut Set Limitations heel press into PB at 90/90 10x5s   Bridge 20 reps   Bridge Limitations iso add with ball   Lumbar Exercises: Sidelying   Clam Limitations x20 reverse clams   Knee/Hip Exercises: Stretches   Active Hamstring Stretch 5 reps  3 PF/DF each   Sports administrator Limitations thomas test position   Piriformis Stretch Limitations press/pull figure 4 3x5s ea   Knee/Hip Exercises: Aerobic   Nustep 5 min L5   Knee/Hip Exercises: Supine   Straight Leg Raises 10 reps   Straight Leg Raise with External Rotation 10 reps   Other Supine Knee/Hip Exercises supine with LE extended, inversion squeeze on ball 10x5s                PT Education - 02/13/16 0850    Education provided Yes   Education Details exercise form/rationale, HEP review, activation of gluts/HS prior to standing from chair, gait pattern    Person(s) Educated Patient   Methods Explanation;Demonstration;Tactile cues;Verbal cues   Comprehension Verbalized understanding;Returned demonstration;Verbal cues required;Tactile cues required;Need further instruction          PT Short Term Goals - 01/31/16 1208    PT SHORT TERM GOAL #1   Title able to stand from chair pain <3/10 by 6/2   Time 3   Period Weeks   Status New   PT SHORT TERM GOAL #2  Title verbalized improvement in ability to sit comfortably.    Time 3   Period Weeks   Status New   PT SHORT TERM GOAL #3   Title Radicular pain remaining proximal to hip   Time 3   Period Weeks   Status New           PT Long Term Goals - 01/31/16 1209    PT LONG TERM GOAL #1   Title FOTO to 44% disability by 6/23   Time 6   Period Weeks   Status New   PT LONG TERM GOAL #2   Title able to stand from chair without pain   Time 6   Period Weeks   Status New   PT LONG TERM GOAL #3   Title pt will be able to cook pain <2/10 upon completion   Time 6   Period Weeks   Status New   PT LONG TERM GOAL #4   Title Will be able to sit in a chair without sensation of RLE going to sleep.    Time 6   Period Weeks   Status New               Plan - 02/13/16 0845    Clinical Impression Statement Cont to demo antalgic gait on L with  limited arm swing. Bilat knees lacking full extension but pt is able to perform heel-toe pattern. Focused on table exercises today due to knee pain.  Pt was able to stand from the chair without LBP or sciatic pain following treatment and cuing today.    PT Treatment/Interventions ADLs/Self Care Home Management;Electrical Stimulation;Cryotherapy;Ultrasound;Traction;Moist Heat;Iontophoresis 4mg /ml Dexamethasone;Gait training;Stair training;Functional mobility training;Therapeutic activities;Therapeutic exercise;Balance training;Patient/family education;Neuromuscular re-education;Manual techniques;Dry needling;Passive range of motion   PT Next Visit Plan gait pattern   PT Home Exercise Plan tennis ball trigger point release, sitting upright in more solid chair, iso heel press into ground before standing   Consulted and Agree with Plan of Care Patient      Patient will benefit from skilled therapeutic intervention in order to improve the following deficits and impairments:  Pain, Difficulty walking, Decreased strength, Increased muscle spasms  Visit Diagnosis: Bilateral low back pain with left-sided sciatica  Muscle weakness (generalized)  Pain In Left Leg     Problem List Patient Active Problem List   Diagnosis Date Noted  . Rectal cancer ([place]) 06/24/2012  . Allergic rhinitis 01/14/2011  . ASTHMA 11/02/2009  . HYPERTENSION 10/17/2009  . ESOPHAGEAL REFLUX 11/09/2008    Jane [name] C. Alyn Jurney PT, DPT 02/13/2016 8:51 AM   [facility] [address] [place], [place], [address] Phone: [phone]   Fax:  [phone]  Name: Jane [name] MRN: [id number] Date of Birth: Mar 27, 1951

## 2016-02-29 ENCOUNTER — Ambulatory Visit: Payer: Medicare Other | Admitting: Physical Therapy

## 2016-03-05 ENCOUNTER — Encounter: Payer: Self-pay | Admitting: Physical Therapy

## 2016-03-05 ENCOUNTER — Ambulatory Visit: Payer: Medicare Other | Attending: Family Medicine | Admitting: Physical Therapy

## 2016-03-05 DIAGNOSIS — M5442 Lumbago with sciatica, left side: Secondary | ICD-10-CM | POA: Insufficient documentation

## 2016-03-05 DIAGNOSIS — M79605 Pain in left leg: Secondary | ICD-10-CM | POA: Insufficient documentation

## 2016-03-05 DIAGNOSIS — M6281 Muscle weakness (generalized): Secondary | ICD-10-CM | POA: Insufficient documentation

## 2016-03-05 DIAGNOSIS — R262 Difficulty in walking, not elsewhere classified: Secondary | ICD-10-CM

## 2016-03-05 NOTE — Therapy (Addendum)
[place], [place], [address] Phone: [phone]   Fax:  [phone]  Physical Therapy Treatment/Discharge Summary  Patient Details  Name: Jane [name] MRN: [id number] Date of Birth: 1951-02-05 Referring Provider: Antony Contras, MD  Encounter Date: 03/05/2016      PT End of Session - 03/05/16 1313    Visit Number 4   Number of Visits 13   Date for PT Re-Evaluation 04/02/16   Authorization Type medicare KX after visit 15   PT Start Time 0808   PT Stop Time 0859   PT Time Calculation (min) 51 min   Activity Tolerance Patient tolerated treatment well   Behavior During Therapy [facility] for tasks assessed/performed      Past Medical History  Diagnosis Date  . Asthma   . Allergic rhinitis   . Hypertension   . Depression   . GERD (gastroesophageal reflux disease)   . Rectal cancer ([place])     Dx 2004  . COPD (chronic obstructive pulmonary disease) [facility])     Past Surgical History  Procedure Laterality Date  . Low anterior bowel resection  2004    For rectal cancer  . Appendectomy  2004  . Colonoscopy      There were no vitals filed for this visit.      Subjective Assessment - 03/05/16 0812    Subjective Reports getting some pain down her leg but was able to control it with tennis ball. Feeling stiff today. Has not been consistent with HEP due to stress and babysitting.    How long can you sit comfortably? 30-45 min   Patient Stated Goals sit to stand without pain, cooking   Currently in Pain? Yes   Pain Score 5    Pain Location Leg   Pain Orientation Left   Pain Descriptors / Indicators Sore  stiff   Aggravating Factors  walking for extended time, slouching while sitting   Pain Relieving Factors sitting on tennis ball            [facility] PT Assessment - 03/05/16 0001    Observation/Other Assessments   Focus on Therapeutic Outcomes (FOTO)  58% disability   Strength   Right Hip Flexion  5/5   Right Hip Extension 4+/5   Right Hip ABduction 4/5   Left Hip Flexion 5/5   Left Hip Extension 4-/5   Left Hip ABduction 4-/5                             PT Education - 03/05/16 1313    Education provided Yes   Education Details exercise form/rationale, effects of knee on back   Person(s) Educated Patient   Methods Explanation;Demonstration;Tactile cues;Verbal cues   Comprehension Verbalized understanding;Returned demonstration;Verbal cues required;Tactile cues required;Need further instruction          PT Short Term Goals - 03/05/16 0824    PT SHORT TERM GOAL #1   Title able to stand from chair pain <3/10 by 6/2   Time 3   Period Weeks   Status Achieved   PT SHORT TERM GOAL #2   Title verbalized improvement in ability to sit comfortably.    Time 3   Period Weeks   Status Achieved   PT SHORT TERM GOAL #3   Title Radicular pain remaining proximal to hip   Baseline radicular pain on L lower extremity to ankle, feels lke it is asleep. resolves when  up and moving   Time 3   Period Weeks   Status On-going           PT Long Term Goals - 2016-03-27 3536    PT LONG TERM GOAL #1   Title FOTO to 44% disability by 7/12   Baseline 42% diability   Time 6   Period Weeks   Status On-going   PT LONG TERM GOAL #2   Title able to stand from chair without pain   Baseline when cognisant of posture   Status Partially Met   PT LONG TERM GOAL #3   Title pt will be able to cook pain <2/10 upon completion   Baseline up to 7/10 in R hip after standing 45 min-1 hour. able to relieve pain with break   Time 6   Period Weeks   Status On-going   PT LONG TERM GOAL #4   Title Will be able to sit in a chair without sensation of RLE going to sleep.    Time 6   Period Weeks   Status New               Plan - 03/27/2016 1315    Clinical Impression Statement Pt presented today with significant lateral tilt of trunk rather than trunk rotation with arm swing  during gait. Has shown progression with strength and pt reports she is able to control trigger points using tennis ball but continues to have pain with standing and walking. Pt is lacking full extension in L knee with notable hard end feel. Discussed knee replacement and rehab following for her information.    Rehab Potential Good   PT Frequency 2x / week   PT Duration 6 weeks   PT Treatment/Interventions ADLs/Self Care Home Management;Electrical Stimulation;Cryotherapy;Ultrasound;Traction;Moist Heat;Iontophoresis '4mg'$ /ml Dexamethasone;Gait training;Stair training;Functional mobility training;Therapeutic activities;Therapeutic exercise;Balance training;Patient/family education;Neuromuscular re-education;Manual techniques;Dry needling;Passive range of motion   PT Next Visit Plan L quad strengthening, core stabilization, measure knee ROM   PT Home Exercise Plan tennis ball trigger point release, sitting upright in more solid chair, iso heel press into ground before standing   Consulted and Agree with Plan of Care Patient      Patient will benefit from skilled therapeutic intervention in order to improve the following deficits and impairments:  Pain, Difficulty walking, Decreased strength, Increased muscle spasms, Abnormal gait, Decreased range of motion  Visit Diagnosis: Bilateral low back pain with left-sided sciatica  Muscle weakness (generalized)  Pain In Left Leg  Difficulty in walking, not elsewhere classified       G-Codes - March 27, 2016 1325    Functional Assessment Tool Used FOTO 58% disability, clinical judgement   Functional Limitation Changing and maintaining body position   Changing and Maintaining Body Position Current Status (R4431) At least 40 percent but less than 60 percent impaired, limited or restricted   Changing and Maintaining Body Position Goal Status (V4008) At least 20 percent but less than 40 percent impaired, limited or restricted      Problem List Patient Active  Problem List   Diagnosis Date Noted  . Rectal cancer ([place]) 06/24/2012  . Allergic rhinitis 01/14/2011  . ASTHMA 11/02/2009  . HYPERTENSION 10/17/2009  . ESOPHAGEAL REFLUX 11/09/2008    Aleatha Taite C. Eiko Mcgowen PT, DPT 27-Mar-2016 1:27 PM ' [facility] [place], [place], [address] Phone: [phone]   Fax:  [phone]  Name: Jane [name] MRN: [id number] Date of Birth: 1951/03/11  PHYSICAL THERAPY DISCHARGE SUMMARY  Visits  from Start of Care: 4  Current functional level related to goals / functional outcomes: See above   Remaining deficits: See above   Education / Equipment: Anatomy of condition, POC, HEP, exercise form/rationale  Plan: Patient agrees to discharge.  Patient goals were not met. Patient is being discharged due to not returning since the last visit.  ?????    Lannis Lichtenwalner C. Swayzie Choate PT, DPT 05/01/16 6:15 PM

## 2016-03-14 ENCOUNTER — Ambulatory Visit: Payer: Medicare Other | Admitting: Physical Therapy

## 2016-12-15 ENCOUNTER — Telehealth: Payer: Self-pay | Admitting: *Deleted

## 2016-12-15 ENCOUNTER — Other Ambulatory Visit: Payer: Medicare Other

## 2016-12-15 ENCOUNTER — Encounter: Payer: Medicare Other | Admitting: Hematology

## 2016-12-15 NOTE — Progress Notes (Signed)
No show OK with no follow up since she is 14 years out of her cancer diagnosis.   This encounter was created in error - please disregard.

## 2016-12-15 NOTE — Telephone Encounter (Signed)
Patient called to reschedule missed appt. Message sent to schedulers.

## 2016-12-16 ENCOUNTER — Telehealth: Payer: Self-pay | Admitting: Hematology

## 2016-12-16 NOTE — Telephone Encounter (Signed)
Patient called to reschedule missed appointments.

## 2016-12-23 NOTE — Progress Notes (Signed)
[facility] OFFICE PROGRESS NOTE  Shirline Frees, MD 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      12/01/2002 Pathology Results    Tumor stage was T2NO, stage I.      12/01/2002 Surgery    Lower anterior resection      12/01/2002 Initial Diagnosis    Rectal cancer, Stage I.      11/06/2003 Imaging    CT C/A/P. No evidence of recurrent or metastatic disease in the chest, abdomen, pelvis.      06/14/2010 Tumor Marker    CEA was 2.3      10/05/2012 Procedure    Colonoscopy (Dr. Silvio Pate).  No evidence of malignancy.  Post surgery changes o/w normal colon.       INTERVAL HISTORY: Jane [name] 66 y.o. female with a history Stage I Rectal Cancer is here for follow-up.  She is doing well today. Since Friday, she had some nausea occasionally. She believes it might be because she gave up greasy foods for lent, but ate fried chicken. Pt does have some mild left chest pain at night. It does not happen often, and lats most of the day when it does happen. She believes it is muscular pain from holding a 22 year old child that she watches. She has started walking with a cane due to her knee pain. Denies bloating, bowel problems, or any other concerns.   MEDICAL HISTORY: Past Medical History:  Diagnosis Date  . Allergic rhinitis   . Asthma   . COPD (chronic obstructive pulmonary disease) ([place])   . Depression   . GERD (gastroesophageal reflux disease)   . Hypertension   . Rectal cancer [facility])    Dx 2004    INTERIM HISTORY: has HYPERTENSION; ASTHMA; ESOPHAGEAL REFLUX; Allergic rhinitis; and Rectal cancer ([place]) on her problem list.    ALLERGIES:  is allergic to morphine.  MEDICATIONS: has a current medication list which includes the following prescription(s): advair diskus,  albuterol, bupropion, lidocaine, meloxicam, multivitamin, NON FORMULARY, paroxetine, and xopenex hfa.  SURGICAL HISTORY:  Past Surgical History:  Procedure Laterality Date  . APPENDECTOMY  2004  . COLONOSCOPY    . LOW ANTERIOR BOWEL RESECTION  2004   For rectal cancer    REVIEW OF SYSTEMS:   Constitutional: Denies fevers, chills or abnormal weight loss Eyes: Denies blurriness of vision Ears, nose, mouth, throat, and face: Denies mucositis or sore throat Respiratory: Denies cough, dyspnea or wheezes Cardiovascular: Denies palpitation, chest discomfort or lower extremity swelling (+) left chest pain Gastrointestinal:  Denies nausea, heartburn or change in bowel habits (+) nausea  Skin: Denies abnormal skin rashes Lymphatics: Denies new lymphadenopathy or easy bruising Neurological:Denies numbness, tingling or new weaknesses Behavioral/Psych: Mood is stable, no new changes  Musc: (+) knee pain  All other systems were reviewed with the patient and are negative.  PHYSICAL EXAMINATION: ECOG PERFORMANCE STATUS: 0 - Asymptomatic  Blood pressure 131/77, pulse 92, temperature 98.3 F (36.8 C), temperature source Oral, resp. rate 18, height 5\' 2"  (1.575 m), weight 197 lb 6.4 oz (89.5 kg), SpO2 99 %.  GENERAL:alert, no distress and comfortable; well developed and well nourished.  SKIN: skin color, texture, turgor are normal, no rashes or significant lesions EYES: normal, Conjunctiva are pink and non-injected, sclera clear OROPHARYNX:no exudate, no erythema  and lips, buccal mucosa, and tongue normal  NECK: supple, thyroid normal size, non-tender, without nodularity LYMPH:  no palpable lymphadenopathy in the cervical, axillary, supraclavicular or bilateral inguinal area LUNGS: clear to auscultation with normal breathing effort, no wheezes or rhonchi HEART: regular rate & rhythm and no murmurs and no lower extremity edema ABDOMEN:abdomen soft, non-tender and normal bowel sounds.   Musculoskeletal:no cyanosis of digits and no clubbing  NEURO: alert & oriented x 3 with fluent speech, no focal motor/sensory deficits  Labs:  CBC Latest Ref Rng & Units 12/29/2016 12/17/2015 12/08/2014  WBC 3.9 - 10.3 10e3/uL 4.7 5.1 5.6  Hemoglobin 11.6 - 15.9 g/dL 12.9 13.0 12.6  Hematocrit 34.8 - 46.6 % 39.5 40.6 40.4  Platelets 145 - 400 10e3/uL 212 216 238    CMP Latest Ref Rng & Units 12/29/2016 12/17/2015 12/08/2014  Glucose 70 - 140 mg/dl 126 135 130  BUN 7.0 - 26.0 mg/dL 17.5 21.3 18.0  Creatinine 0.6 - 1.1 mg/dL 0.8 0.9 0.8  Sodium 136 - 145 mEq/L 142 141 140  Potassium 3.5 - 5.1 mEq/L 4.3 4.3 4.9  Chloride 98 - 107 mEq/L - - -  CO2 22 - 29 mEq/L 30(H) 28 28  Calcium 8.4 - 10.4 mg/dL 9.4 9.1 9.3  Total Protein 6.4 - 8.3 g/dL 7.2 7.3 7.5  Total Bilirubin 0.20 - 1.20 mg/dL 0.92 0.57 0.44  Alkaline Phos 40 - 150 U/L 98 80 90  AST 5 - 34 U/L 23 31 27   ALT 0 - 55 U/L 24 36 32   RADIOLOGY: I have reviewed the images below and agree with the reported results  Mammogram 01/25/2016 IMPRESSION: No mammographic evidence of malignancy. A result letter of this screening mammogram will be mailed directly to the patient.  ASSESSMENT: Jane [name] 66 y.o. female with a history of Rectal cancer ([place])  1. Rectal cancer, Stage I (T2N0). --She continues to do well.  She is now out nearly 14 years from the time of her diagnosis without evidence of recurrence.  She has had a colonoscopy by Dr. Silvio Pate in 09/2012 which was negative except the surgical scar.  -She has completed colon cancer surveillance -Discharge today since she is 14 years out of her cancer diagnosis, with no evidence of recurrence.    -Colonoscopy due in January 2019.   2. Cancer screening -Her last screening mammogram was normal in May 2017, I encouraged her to continue annual screening. The -I encouraged her to follow-up with her gynecologist for Pap smear.  3. Hypertension and arthritis -She will continue follow-up  with her primary care physician  4. epigastric discomfort and nausea  -likely gastroenteritis -I encouraged her follow-up with her primary care physician or Dr. Fuller Plan if her symptoms persist.  5. Left chest pain -Likely muscular in nature, I encouraged her to use Tylenol and heating pad if needed.  Plan  -I'll see her back PRN. Discharge today. F/u with PCP and GI.    All questions were answered. The patient knows to call the clinic with any problems, questions or concerns. We can certainly see the patient much sooner if necessary.  I spent 15 minutes counseling the patient face to face. The total time spent in the appointment was 20 minutes.  This document serves as a record of services personally performed by Truitt Merle, MD. It was created on her behalf by Martinique Casey, a trained medical scribe. The creation of this record is based on the scribe's personal observations and the provider's  statements to them. This document has been checked and approved by the attending provider.  I have reviewed the above documentation for accuracy and completeness and I agree with the above.   Truitt Merle, MD 12/29/2016

## 2016-12-29 ENCOUNTER — Other Ambulatory Visit (HOSPITAL_BASED_OUTPATIENT_CLINIC_OR_DEPARTMENT_OTHER): Payer: Medicare Other

## 2016-12-29 ENCOUNTER — Ambulatory Visit (HOSPITAL_BASED_OUTPATIENT_CLINIC_OR_DEPARTMENT_OTHER): Payer: Medicare Other | Admitting: Hematology

## 2016-12-29 ENCOUNTER — Encounter: Payer: Self-pay | Admitting: Hematology

## 2016-12-29 VITALS — BP 131/77 | HR 92 | Temp 98.3°F | Resp 18 | Ht 62.0 in | Wt 197.4 lb

## 2016-12-29 DIAGNOSIS — C20 Malignant neoplasm of rectum: Secondary | ICD-10-CM

## 2016-12-29 DIAGNOSIS — R110 Nausea: Secondary | ICD-10-CM | POA: Diagnosis not present

## 2016-12-29 DIAGNOSIS — R1013 Epigastric pain: Secondary | ICD-10-CM | POA: Diagnosis not present

## 2016-12-29 DIAGNOSIS — I10 Essential (primary) hypertension: Secondary | ICD-10-CM

## 2016-12-29 DIAGNOSIS — Z85048 Personal history of other malignant neoplasm of rectum, rectosigmoid junction, and anus: Secondary | ICD-10-CM | POA: Diagnosis not present

## 2016-12-29 LAB — COMPREHENSIVE METABOLIC PANEL
ALT: 24 U/L (ref 0–55)
AST: 23 U/L (ref 5–34)
Albumin: 4.1 g/dL (ref 3.5–5.0)
Alkaline Phosphatase: 98 U/L (ref 40–150)
Anion Gap: 8 mEq/L (ref 3–11)
BILIRUBIN TOTAL: 0.92 mg/dL (ref 0.20–1.20)
BUN: 17.5 mg/dL (ref 7.0–26.0)
CHLORIDE: 104 meq/L (ref 98–109)
CO2: 30 meq/L — AB (ref 22–29)
CREATININE: 0.8 mg/dL (ref 0.6–1.1)
Calcium: 9.4 mg/dL (ref 8.4–10.4)
EGFR: 89 mL/min/{1.73_m2} — ABNORMAL LOW (ref >90)
GLUCOSE: 126 mg/dL (ref 70–140)
Potassium: 4.3 mEq/L (ref 3.5–5.1)
SODIUM: 142 meq/L (ref 136–145)
TOTAL PROTEIN: 7.2 g/dL (ref 6.4–8.3)

## 2016-12-29 LAB — CBC WITH DIFFERENTIAL/PLATELET
BASO%: 0.8 % (ref 0.0–2.0)
Basophils Absolute: 0 10*3/uL (ref 0.0–0.1)
EOS%: 1.9 % (ref 0.0–7.0)
Eosinophils Absolute: 0.1 10*3/uL (ref 0.0–0.5)
HCT: 39.5 % (ref 34.8–46.6)
HGB: 12.9 g/dL (ref 11.6–15.9)
LYMPH%: 31.2 % (ref 14.0–49.7)
MCH: 28.1 pg (ref 25.1–34.0)
MCHC: 32.6 g/dL (ref 31.5–36.0)
MCV: 86.1 fL (ref 79.5–101.0)
MONO#: 0.3 10*3/uL (ref 0.1–0.9)
MONO%: 6.3 % (ref 0.0–14.0)
NEUT#: 2.8 10*3/uL (ref 1.5–6.5)
NEUT%: 59.8 % (ref 38.4–76.8)
PLATELETS: 212 10*3/uL (ref 145–400)
RBC: 4.58 10*6/uL (ref 3.70–5.45)
RDW: 12.6 % (ref 11.2–14.5)
WBC: 4.7 10*3/uL (ref 3.9–10.3)
lymph#: 1.5 10*3/uL (ref 0.9–3.3)

## 2016-12-31 ENCOUNTER — Telehealth: Payer: Self-pay | Admitting: Hematology

## 2016-12-31 NOTE — Telephone Encounter (Signed)
No LOS per 12/29/16 visit.

## 2017-02-02 ENCOUNTER — Other Ambulatory Visit: Payer: Self-pay | Admitting: Family Medicine

## 2017-02-02 DIAGNOSIS — Z1231 Encounter for screening mammogram for malignant neoplasm of breast: Secondary | ICD-10-CM

## 2017-02-04 ENCOUNTER — Ambulatory Visit
Admission: RE | Admit: 2017-02-04 | Discharge: 2017-02-04 | Disposition: A | Discharge status: Home / Self Care | Payer: Medicare Other | Source: Ambulatory Visit | Attending: Family Medicine | Admitting: Family Medicine

## 2017-02-04 ENCOUNTER — Other Ambulatory Visit: Payer: Self-pay | Admitting: Family Medicine

## 2017-02-04 DIAGNOSIS — Z1231 Encounter for screening mammogram for malignant neoplasm of breast: Secondary | ICD-10-CM

## 2017-09-22 HISTORY — PX: COLONOSCOPY: SHX174

## 2017-10-27 ENCOUNTER — Encounter: Payer: Self-pay | Admitting: Gastroenterology

## 2017-10-30 ENCOUNTER — Encounter: Payer: Self-pay | Admitting: Gastroenterology

## 2017-12-15 ENCOUNTER — Other Ambulatory Visit: Payer: Self-pay

## 2017-12-15 ENCOUNTER — Ambulatory Visit (AMBULATORY_SURGERY_CENTER): Payer: Self-pay

## 2017-12-15 VITALS — Ht 61.0 in | Wt 189.8 lb

## 2017-12-15 DIAGNOSIS — C20 Malignant neoplasm of rectum: Secondary | ICD-10-CM

## 2017-12-15 MED ORDER — NA SULFATE-K SULFATE-MG SULF 17.5-3.13-1.6 GM/177ML PO SOLN: 1.0000 | Freq: Once | ORAL | 0 refills | Status: AC

## 2017-12-15 NOTE — Progress Notes (Signed)
Denies allergies to eggs or soy products. Denies complication of anesthesia or sedation. Denies use of weight loss medication. Denies use of O2.   Emmi instructions declined.  

## 2017-12-29 ENCOUNTER — Encounter: Payer: Medicare Other | Admitting: Gastroenterology

## 2018-01-04 ENCOUNTER — Encounter: Payer: Self-pay | Admitting: Gastroenterology

## 2018-01-18 ENCOUNTER — Encounter: Payer: Self-pay | Admitting: Gastroenterology

## 2018-01-18 ENCOUNTER — Other Ambulatory Visit: Payer: Self-pay

## 2018-01-18 ENCOUNTER — Ambulatory Visit (AMBULATORY_SURGERY_CENTER): Payer: Medicare Other | Admitting: Gastroenterology

## 2018-01-18 VITALS — BP 126/72 | HR 87 | Temp 97.1°F | Resp 14 | Ht 61.0 in | Wt 189.0 lb

## 2018-01-18 DIAGNOSIS — D124 Benign neoplasm of descending colon: Secondary | ICD-10-CM

## 2018-01-18 DIAGNOSIS — Z85038 Personal history of other malignant neoplasm of large intestine: Secondary | ICD-10-CM

## 2018-01-18 DIAGNOSIS — K635 Polyp of colon: Secondary | ICD-10-CM | POA: Diagnosis not present

## 2018-01-18 DIAGNOSIS — D120 Benign neoplasm of cecum: Secondary | ICD-10-CM

## 2018-01-18 DIAGNOSIS — D123 Benign neoplasm of transverse colon: Secondary | ICD-10-CM | POA: Diagnosis not present

## 2018-01-18 MED ORDER — SODIUM CHLORIDE 0.9 % IV SOLN: 500.0000 mL | Freq: Once | INTRAVENOUS | Status: DC

## 2018-01-18 NOTE — Progress Notes (Signed)
Pt's states no medical or surgical changes since previsit or office visit. 

## 2018-01-18 NOTE — Progress Notes (Signed)
Called to room to assist during endoscopic procedure.  Patient ID and intended procedure confirmed with present staff. Received instructions for my participation in the procedure from the performing physician.  

## 2018-01-18 NOTE — Op Note (Signed)
[facility] Patient Name: Jane [name] Procedure Date: 01/18/2018 8:52 AM MRN: [id number] Endoscopist: Ladene Artist , MD Age: 67 Referring MD:  Date of Birth: 1950/11/29 Gender: Female Account #: [id number] Procedure:                Colonoscopy Indications:              High risk colon cancer surveillance: Personal                            history of colon cancer Medicines:                Monitored Anesthesia Care Procedure:                Pre-Anesthesia Assessment:                           - Prior to the procedure, a History and Physical                            was performed, and patient medications and                            allergies were reviewed. The patient's tolerance of                            previous anesthesia was also reviewed. The risks                            and benefits of the procedure and the sedation                            options and risks were discussed with the patient.                            All questions were answered, and informed consent                            was obtained. Prior Anticoagulants: The patient has                            taken no previous anticoagulant or antiplatelet                            agents. ASA Grade Assessment: II - A patient with                            mild systemic disease. After reviewing the risks                            and benefits, the patient was deemed in                            satisfactory condition to undergo the procedure.  After obtaining informed consent, the colonoscope                            was passed under direct vision. Throughout the                            procedure, the patient's blood pressure, pulse, and                            oxygen saturations were monitored continuously. The                            Colonoscope was introduced through the anus and                            advanced to the the cecum, identified by                             appendiceal orifice and ileocecal valve. The                            ileocecal valve, appendiceal orifice, and rectum                            were photographed. The quality of the bowel                            preparation was excellent. The colonoscopy was                            performed without difficulty. The patient tolerated                            the procedure well. Scope In: 9:03:19 AM Scope Out: 9:16:00 AM Scope Withdrawal Time: 0 hours 9 minutes 56 seconds  Total Procedure Duration: 0 hours 12 minutes 41 seconds  Findings:                 The perianal and digital rectal examinations were                            normal.                           Two sessile polyps were found in the descending                            colon and transverse colon. The polyps were 6 mm in                            size. These polyps were removed with a cold snare.                            Resection and retrieval were complete.  There was evidence of a prior end-to-end                            colo-colonic anastomosis in the recto-sigmoid                            colon. This was patent and was characterized by                            healthy appearing mucosa. The anastomosis was                            traversed.                           The exam was otherwise without abnormality on                            direct and retroflexion views. Complications:            No immediate complications. Estimated blood loss:                            None. Estimated Blood Loss:     Estimated blood loss: none. Impression:               - Two 6 mm polyps in the descending colon and in                            the transverse colon, removed with a cold snare.                            Resected and retrieved.                           - Patent end-to-end colo-colonic anastomosis,                            characterized by  healthy appearing mucosa.                           - The examination was otherwise normal on direct                            and retroflexion views. Recommendation:           - Repeat colonoscopy in 3 - 5 years for                            surveillance pending pathology review.                           - Patient has a contact number available for                            emergencies. The signs and symptoms of potential  delayed complications were discussed with the                            patient. Return to normal activities tomorrow.                            Written discharge instructions were provided to the                            patient.                           - Resume previous diet.                           - Continue present medications.                           - Await pathology results. Ladene Artist, MD 01/18/2018 9:19:49 AM This report has been signed electronically.

## 2018-01-18 NOTE — Progress Notes (Signed)
Report to PACU, RN, vss, BBS= Clear.  

## 2018-01-18 NOTE — Patient Instructions (Signed)
HANDOUT GIVEN FOR POLYPS  YOU HAD AN ENDOSCOPIC PROCEDURE TODAY AT THE [facility]:   Refer to the procedure report that was given to you for any specific questions about what was found during the examination.  If the procedure report does not answer your questions, please call your gastroenterologist to clarify.  If you requested that your care partner not be given the details of your procedure findings, then the procedure report has been included in a sealed envelope for you to review at your convenience later.  YOU SHOULD EXPECT: Some feelings of bloating in the abdomen. Passage of more gas than usual.  Walking can help get rid of the air that was put into your GI tract during the procedure and reduce the bloating. If you had a lower endoscopy (such as a colonoscopy or flexible sigmoidoscopy) you may notice spotting of blood in your stool or on the toilet paper. If you underwent a bowel prep for your procedure, you may not have a normal bowel movement for a few days.  Please Note:  You might notice some irritation and congestion in your nose or some drainage.  This is from the oxygen used during your procedure.  There is no need for concern and it should clear up in a day or so.  SYMPTOMS TO REPORT IMMEDIATELY:   Following lower endoscopy (colonoscopy or flexible sigmoidoscopy):  Excessive amounts of blood in the stool  Significant tenderness or worsening of abdominal pains  Swelling of the abdomen that is new, acute  Fever of 100F or higher  For urgent or emergent issues, a gastroenterologist can be reached at any hour by calling [phone].   DIET:  We do recommend a small meal at first, but then you may proceed to your regular diet.  Drink plenty of fluids but you should avoid alcoholic beverages for 24 hours.  ACTIVITY:  You should plan to take it easy for the rest of today and you should NOT DRIVE or use heavy machinery until tomorrow (because of the sedation medicines  used during the test).    FOLLOW UP: Our staff will call the number listed on your records the next business day following your procedure to check on you and address any questions or concerns that you may have regarding the information given to you following your procedure. If we do not reach you, we will leave a message.  However, if you are feeling well and you are not experiencing any problems, there is no need to return our call.  We will assume that you have returned to your regular daily activities without incident.  If any biopsies were taken you will be contacted by phone or by letter within the next 1-3 weeks.  Please call us at [phone] if you have not heard about the biopsies in 3 weeks.    SIGNATURES/CONFIDENTIALITY: You and/or your care partner have signed paperwork which will be entered into your electronic medical record.  These signatures attest to the fact that that the information above on your After Visit Summary has been reviewed and is understood.  Full responsibility of the confidentiality of this discharge information lies with you and/or your care-partner. 

## 2018-01-19 ENCOUNTER — Telehealth: Payer: Self-pay | Admitting: *Deleted

## 2018-01-19 NOTE — Telephone Encounter (Signed)
  Follow up Call-  Call back number 01/18/2018  Post procedure Call Back phone  # [phone]  Permission to leave phone message Yes  Some recent data might be hidden     Patient questions:  Do you have a fever, pain , or abdominal swelling? No. Pain Score  0 *  Have you tolerated food without any problems? Yes.    Have you been able to return to your normal activities? Yes.    Do you have any questions about your discharge instructions: Diet   No. Medications  No. Follow up visit  No.  Do you have questions or concerns about your Care? No.  Actions: * If pain score is 4 or above: No action needed, pain <4.

## 2018-01-24 ENCOUNTER — Encounter: Payer: Self-pay | Admitting: Gastroenterology

## 2018-11-24 NOTE — H&P (Signed)
TOTAL KNEE ADMISSION H&P  Patient is being admitted for left total knee arthroplasty.  Subjective:  Chief Complaint:left knee pain.  HPI: Jane [name], 68 y.o. female, has a history of pain and functional disability in the left knee due to arthritis and has failed non-surgical conservative treatments for greater than 12 weeks to includecorticosteriod injections, viscosupplementation injections and activity modification.  Onset of symptoms was gradual, starting >10 years ago with gradually worsening course since that time. The patient noted no past surgery on the left knee(s).  Patient currently rates pain in the left knee(s) at 7 out of 10 with activity. Patient has worsening of pain with activity and weight bearing and joint swelling.  Patient has evidence of bone-on-bone arthritis in the medial and patellofemoral compartments with massive osteophyte formation by imaging studies. There is no active infection.  Patient Active Problem List   Diagnosis Date Noted  . Rectal cancer ([place]) 06/24/2012  . Allergic rhinitis 01/14/2011  . ASTHMA 11/02/2009  . HYPERTENSION 10/17/2009  . ESOPHAGEAL REFLUX 11/09/2008   Past Medical History:  Diagnosis Date  . Allergic rhinitis   . Allergy   . Anxiety   . Arthritis   . Asthma   . Cataract   . COPD (chronic obstructive pulmonary disease) ([place])   . Depression   . GERD (gastroesophageal reflux disease)   . Heart murmur   . Hypertension   . Rectal cancer [facility])    Dx 2004    Past Surgical History:  Procedure Laterality Date  . APPENDECTOMY  2004  . COLONOSCOPY    . LOW ANTERIOR BOWEL RESECTION  2004   For rectal cancer    Current Facility-Administered Medications  Medication Dose Route Frequency Provider Last Rate Last Dose  . 0.9 %  sodium chloride infusion  500 mL Intravenous Once Ladene Artist, MD       Current Outpatient Medications  Medication Sig Dispense Refill Last Dose  . ADVAIR DISKUS 250-50 MCG/DOSE AEPB    01/17/2018  .  albuterol (VENTOLIN HFA) 108 (90 BASE) MCG/ACT inhaler Inhale 2 puffs into the lungs every 4 (four) hours as needed. 18 g 5 Unknown  . buPROPion (WELLBUTRIN XL) 150 MG 24 hr tablet Take 150 mg by mouth daily.    01/17/2018  . lidocaine (XYLOCAINE) 5 % ointment 4 (four) times daily as needed.  3 Unknown  . meloxicam (MOBIC) 15 MG tablet Take 15 mg by mouth daily.   01/17/2018  . Multiple Vitamin (MULTIVITAMIN) tablet Take 1 tablet by mouth daily.   01/17/2018  . NON FORMULARY daily. Hair, skin, and nail vitamin   Not Taking  . OVER THE COUNTER MEDICATION Vitamin D 3, one tablet once daily.   Past Week  . OVER THE COUNTER MEDICATION Calcium, one tablet every other day.   01/17/2018  . PARoxetine (PAXIL) 10 MG tablet Take 5 mg by mouth every morning.    01/17/2018  . XOPENEX HFA 45 MCG/ACT inhaler Inhale 1 puff into the lungs daily. Reported on 01/31/2016   Unknown   Allergies  Allergen Reactions  . Morphine     REACTION: itching    Social History   Tobacco Use  . Smoking status: Never Smoker  . Smokeless tobacco: Never Used  Substance Use Topics  . Alcohol use: Yes    Comment: Socially wine and beer     Family History  Problem Relation Age of Onset  . Colon cancer Other        great aunt  .  Stomach cancer Neg Hx   . Breast cancer Neg Hx   . Esophageal cancer Neg Hx   . Liver cancer Neg Hx   . Pancreatic cancer Neg Hx   . Rectal cancer Neg Hx      Review of Systems  Constitutional: Negative for chills and fever.  HENT: Negative for congestion, sore throat and tinnitus.   Eyes: Negative for double vision, photophobia and pain.  Respiratory: Negative for cough, shortness of breath and wheezing.   Cardiovascular: Negative for chest pain, palpitations and orthopnea.  Gastrointestinal: Negative for heartburn, nausea and vomiting.  Genitourinary: Negative for dysuria, frequency and urgency.  Musculoskeletal: Positive for joint pain.  Neurological: Negative for dizziness, weakness and  headaches.    Objective:  Physical Exam  Well nourished and well developed. General: Alert and oriented x3, cooperative and pleasant, no acute distress. Head: normocephalic, atraumatic, neck supple. Eyes: EOMI. Respiratory: breath sounds clear in all fields, no wheezing, rales, or rhonchi. Cardiovascular: Regular rate and rhythm, no murmurs, gallops or rubs.  Abdomen: non-tender to palpation and soft, normoactive bowel sounds.  Musculoskeletal: Left Knee Exam:  Varus deformity, worse on the left compared to the right.  No effusion. No Swelling. Range of motion is 10-100 degrees.  Marked crepitus on range of motion of the knee.  Positive medial greater than lateral joint line tenderness Stable knee.  Right Knee Exam:  Varus deformity, worse on the left compared to the right. No effusion. No Swelling. Range of motion is 5-100 degrees.  Moderate crepitus on range of motion of the knee.  Positive medial greater than lateral joint line tenderness.  Stable knee.   Calves soft and nontender. Motor function intact in LE. Strength 5/5 LE bilaterally. Neuro: Distal pulses 2+. Sensation to light touch intact in LE.  Vital signs in last 24 hours: Blood pressure: 142/88 mmHg  Labs:   Estimated body mass index is 35.71 kg/m as calculated from the following:   Height as of 01/18/18: 5\' 1"  (1.549 m).   Weight as of 01/18/18: 85.7 kg.   Imaging Review Plain radiographs demonstrate severe degenerative joint disease of the left knee(s). The overall alignment isneutral. The bone quality appears to be adequate for age and reported activity level.      Assessment/Plan:  End stage arthritis, left knee   The patient history, physical examination, clinical judgment of the provider and imaging studies are consistent with end stage degenerative joint disease of the left knee(s) and total knee arthroplasty is deemed medically necessary. The treatment options including medical management,  injection therapy arthroscopy and arthroplasty were discussed at length. The risks and benefits of total knee arthroplasty were presented and reviewed. The risks due to aseptic loosening, infection, stiffness, patella tracking problems, thromboembolic complications and other imponderables were discussed. The patient acknowledged the explanation, agreed to proceed with the plan and consent was signed. Patient is being admitted for inpatient treatment for surgery, pain control, PT, OT, prophylactic antibiotics, VTE prophylaxis, progressive ambulation and ADL's and discharge planning. The patient is planning to be discharged home.    Anticipated LOS equal to or greater than 2 midnights due to - Age 52 and older with one or more of the following:  - Obesity  - Expected need for hospital services (PT, OT, Nursing) required for safe  discharge  - Anticipated need for postoperative skilled nursing care or inpatient rehab  - Active co-morbidities: Respiratory Failure/COPD OR   - Unanticipated findings during/Post Surgery: None  - Patient is  a high risk of re-admission due to: None   Therapy Plans: Possible rehab then outpatient therapy (Pt home alone without a ride) Disposition: inpatient rehab vs home Planned DVT Prophylaxis: aspirin 325mg  BID DME needed: walker PCP: Dr. Kenton Kingfisher  TXA: IV Allergies: morphine - itching Anesthesia Concerns: none BMI: 36.6 Last HgbA1c: 6.2% Other: Hx colon cancer in 2004. Recent atypical chest pain, will have stress test on 3/11.   - Patient was instructed on what medications to stop prior to surgery. - Follow-up visit in 2 weeks with Dr. Wynelle Link - Begin physical therapy following surgery - Pre-operative lab work as pre-surgical testing - Prescriptions will be provided in hospital at time of discharge  Theresa Duty, PA-C Orthopedic Surgery [facility] [place]

## 2018-12-01 ENCOUNTER — Other Ambulatory Visit: Payer: Self-pay

## 2018-12-01 ENCOUNTER — Encounter: Payer: Self-pay | Admitting: Cardiology

## 2018-12-01 ENCOUNTER — Ambulatory Visit: Payer: Medicare Other | Admitting: Cardiology

## 2018-12-01 VITALS — BP 150/76 | HR 92 | Ht 60.0 in | Wt 190.4 lb

## 2018-12-01 DIAGNOSIS — Z01810 Encounter for preprocedural cardiovascular examination: Secondary | ICD-10-CM | POA: Diagnosis not present

## 2018-12-01 DIAGNOSIS — R0789 Other chest pain: Secondary | ICD-10-CM | POA: Diagnosis not present

## 2018-12-01 NOTE — Progress Notes (Signed)
PCP: Shirline Frees, MD  Clinic Note: Chief Complaint  Patient presents with  . New Patient (Initial Visit)  . Pre-op Exam    HPI: Jane [name] is a 68 y.o. female with H/O COPD/Asthma (seasonal) as well as h/o Rectal Cancer who is being seen today for the Preoperative CV evaluation for Knee Surgery (with ? Atypical Chest Pain) at the request of Shirline Frees, MD. --She was told that as a child she had a history of a heart murmur.  JESSCIA IMM was last seen on   Recent Hospitalizations: none  Studies Personally Reviewed - (if available, images/films reviewed: From Epic Chart or Care Everywhere)  none  Interval History: Virjean presents here today for cardiology evaluation for preop knee surgery.  Has been cleared medically, but I guess because of some intermittent episodes of chest discomfort has been referred for cardiology evaluation.  Reported history of it heart murmur seems to be a childhood murmur because clinical examination does not reveal a murmur (not noted on PCP evaluation or on my exam).  She has hypertension hyperlipidemia and prediabetes by report (A1c 6.4). She has been limited as far as her walking activity because of her knees, but is doing her routine exercise with water aerobics.  She denies having any chest discomfort while doing her exercises, but does have a some tightness in her chest when she is anxious or stressed.  She says that she also has some right shoulder and arm pain that may come and go.  Both the symptoms are relieved with being in the whirlpool and water aerobics. She denies any of the chest tightness or pressure with her routine activities or water aerobics.  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  Only has swelling associated with her arthritis.  No sensation of rapid irregular beats or  palpitations. No lightheadedness, dizziness, weakness or syncope/near syncope. No TIA/amaurosis fugax symptoms. No melena, hematochezia, hematuria, or epstaxis. No claudication.  ROS: A comprehensive was performed. Review of Systems  Constitutional: Negative for malaise/fatigue and weight loss.  HENT: Negative for congestion and nosebleeds.   Respiratory: Negative for cough, sputum production, shortness of breath and wheezing.        Seasonal allergy related asthma seems to be relatively stable right now.  Gastrointestinal: Positive for heartburn. Negative for blood in stool, constipation, melena and nausea.  Genitourinary: Negative for hematuria.  Musculoskeletal: Positive for joint pain (Both knee osteoarthritis.).  Neurological: Negative for dizziness, tingling, focal weakness and headaches.  Psychiatric/Behavioral: Positive for depression (Seems to be stable, but does have anxiety associated with it.). Negative for memory loss. The patient is nervous/anxious. The patient does not have insomnia.   All other systems reviewed and are negative.  This is   I have reviewed and (if needed) personally updated the patient's problem list, medications, allergies, past medical and surgical history, social and family history.   Past Medical History:  Diagnosis Date  . Allergic rhinitis   . Allergy   . Anxiety   . Arthritis   . Asthma   . Cataract   . COPD (chronic obstructive pulmonary disease) ([place])   . Depression   . GERD (gastroesophageal reflux disease)   . Heart murmur    Diagnosed as a child.  . Hypertension   . Rectal cancer ([place])  Dx 2004    Past Surgical History:  Procedure Laterality Date  . APPENDECTOMY  2004  . COLONOSCOPY    . LOW ANTERIOR BOWEL RESECTION  2004   For rectal cancer    Current Meds  Medication Sig  . ADVAIR DISKUS 250-50 MCG/DOSE AEPB   . albuterol (VENTOLIN HFA) 108 (90 BASE) MCG/ACT inhaler Inhale 2 puffs into the lungs every 4 (four) hours  as needed.  Marland Kitchen buPROPion (WELLBUTRIN XL) 150 MG 24 hr tablet Take 150 mg by mouth daily.   Marland Kitchen lidocaine (XYLOCAINE) 5 % ointment 4 (four) times daily as needed.  . meloxicam (MOBIC) 15 MG tablet Take 15 mg by mouth daily.  . Multiple Vitamin (MULTIVITAMIN) tablet Take 1 tablet by mouth daily.  . NON FORMULARY daily. Hair, skin, and nail vitamin  . OVER THE COUNTER MEDICATION Vitamin D 3, one tablet once daily.  Marland Kitchen OVER THE COUNTER MEDICATION Calcium, one tablet every other day.  Marland Kitchen PARoxetine (PAXIL) 10 MG tablet Take 5 mg by mouth every morning.   Penne Lash HFA 45 MCG/ACT inhaler Inhale 1 puff into the lungs daily. Reported on 01/31/2016   Current Facility-Administered Medications for the 12/01/18 encounter (Office Visit) with Leonie Man, MD  Medication  . 0.9 %  sodium chloride infusion    Allergies  Allergen Reactions  . Morphine Itching    REACTION: itching    Social History   Tobacco Use  . Smoking status: Never Smoker  . Smokeless tobacco: Never Used  Substance Use Topics  . Alcohol use: Yes    Comment: Socially wine and beer   . Drug use: No   Social History   Social History Narrative   Divorced mother of 2 (1 daughter 1 son.  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  She lives alone.     1 glass of wine or beers per week.   She does water aerobics for 45 minutes at a time 2 to 3 days a week.  She also does home PT    family history includes CAD in her mother; Colon cancer in an other family member; Congestive Heart Failure (age of onset: 70) in her mother; Glaucoma in her brother; Healthy in her brother and sister; Hypertension in her mother; Kidney failure in her mother; Multiple myeloma in her maternal grandmother; Other in her father; Prostate cancer in her brother and brother.  Wt Readings from Last 3 Encounters:  12/01/18 190 lb 6.4 oz (86.4 kg)  01/18/18 189 lb (85.7 kg)  12/15/17 189 lb 12.8 oz (86.1 kg)    PHYSICAL EXAM BP (!) 150/76   Pulse 92   Ht 5' (1.524 m)    Wt 190 lb 6.4 oz (86.4 kg)   SpO2 98%   BMI 37.18 kg/m --Very anxious about being in a cardiologist office Physical Exam  Constitutional: She is oriented to person, place, and time. She appears well-developed and well-nourished. No distress.  Healthy-appearing.  Moderately obese  HENT:  Head: Normocephalic and atraumatic.  Mouth/Throat: Oropharynx is clear and moist.  Eyes: Pupils are equal, round, and reactive to light. Conjunctivae and EOM are normal. No scleral icterus.  Neck: Normal range of motion. Neck supple. No hepatojugular reflux and no JVD present. Carotid bruit is not present.  Cardiovascular: Normal rate, regular rhythm, normal heart sounds and intact distal pulses.  No extrasystoles are present. PMI is not displaced. Exam reveals no gallop.  No murmur heard. Pulmonary/Chest: Breath sounds normal. No respiratory distress. She has no wheezes.  She has no rales.  Abdominal: Soft. Bowel sounds are normal. She exhibits no distension. There is no abdominal tenderness.  Musculoskeletal: Normal range of motion.        General: No edema.  Neurological: She is alert and oriented to person, place, and time. No cranial nerve deficit.  Psychiatric: She has a normal mood and affect. Her behavior is normal. Judgment and thought content normal.  Vitals reviewed.   Adult ECG Report  Rate: 92;  Rhythm: normal sinus rhythm and Left axis deviation upper -42).  Cannot exclude anterior MI, age undetermined -poor R wave progression in precordial leads.;  Otherwise normal EKG  Narrative Interpretation: Relatively normal EKG   Other studies Reviewed: Additional studies/ records that were reviewed today include:  Recent Labs: November 01, 2018  Na+ 140, K+ 4.4, Cl- 101, HCO3-36, BUN 18, Cr 0.72, Glu 114, Ca2+ 9.8; AST 18, ALT 14, AlkP 89  CBC: W 4.4, H/H 12.7/39.3, Plt 209; Hgb A1c 6.2  TC 236, TG 134, HDL 63, LDL 140 **; TSH 0.1   ASSESSMENT / PLAN:  Christiana has clearly atypical  sounding chest discomfort.  It is not exertional in nature.  Is not consistent with a anginal symptoms.  She had a history of murmur listed, but there is no murmur on exam.  I would suggest that her chest pain is noncardiac in nature and does not need a cardiology evaluation prior to having her surgery.  As such, no cardiac evaluation is required with no murmur on exam either. Would be okay to proceed with surgery without any further cardiac evaluation.  Can return on an as-needed basis.  Problem List Items Addressed This Visit    Non-cardiac chest pain    Very atypical sounding chest discomfort is probably more related to his anxiety and panic attacks.  She is not having anything with exertion which would make it unlikely to be angina.    Would not recommend stress test evaluation, especially before surgery.      Relevant Orders   EKG 12-Lead (Completed)   Preoperative cardiovascular examination - Primary    PREOPERATIVE CARDIAC RISK ASSESSMENT   Revised Cardiac Risk Index:  High Risk Surgery: no; knee surgery will be considered low risk cardiac standpoint  Defined as Intraperitoneal, intrathoracic or suprainguinal vascular  Active CAD: no; no anginal type symptoms  CHF: no; no PND, orthopnea or edema.  No exertional dyspnea  Cerebrovascular Disease: no;   Diabetes: A1c 6.2-6.4.  Not on meds; On Insulin: no  CKD (Cr >~ 2): no; 0.72  Total: 0 Estimated Risk of Adverse Outcome: LOW RISK Estimated Risk of MI, PE, VF/VT (Cardiac Arrest), Complete Heart Block: <1 %   ACC/AHA Guidelines for "Clearance":  Step 1 - Need for Emergency Surgery: No:   If Yes - go straight to OR with perioperative surveillance  Step 2 - Active Cardiac Conditions (Unstable Angina, Decompensated HF, Significant  Arrhytmias - Complete HB, Mobitz II, Symptomatic VT or SVT, Severe Aortic Stenosis - mean gradient > 40 mmHg, Valve area < 1.0 cm2):   No:   If Yes - Evaluate & Treat per ACC/AHA  Guidelines  Step 3 -  Low Risk Surgery: Yes  If Yes --> proceed to OR  If No --> Step 4  Step 4 - Functional Capacity >= 4 METS without symptoms: Yes  If Yes --> proceed to OR  If No --> Step 5  Step 5 --  Clinical Risk Factors (CRF)   3 or more /  1-2 or more CRFs: No: See above  If Yes -- assess Surgical Risk, --   (High Risk Non-cardiac), Intraabdominal or thoracic vascular surgery consider testing if it will change management.  Intermediate Risk: Proceed to OR with HR control, or consider testing if it will change management   No CRFs: Yes  If Yes --> Proceed to OR  Recommend proceeding with the OR procedure without any cardiac evaluation.      Relevant Orders   EKG 12-Lead (Completed)      I spent a total of 25 minutes with the patient and chart review. >  50% of the time was spent in direct patient consultation.   Current medicines are reviewed at length with the patient today.  (+/- concerns) none The following changes have been made:  None  Patient Instructions  Medication Instructions:  Not needed If you need a refill on your cardiac medications before your next appointment, please call your pharmacy.   Lab work: Not needed If you have labs (blood work) drawn today and your tests are completely normal, you will receive your results only by: Marland Kitchen MyChart Message (if you have MyChart) OR . A paper copy in the mail If you have any lab test that is abnormal or we need to change your treatment, we will call you to review the results.  Testing/Procedures: Not needed Follow-Up: At [facility], you and your health needs are our priority.  As part of our continuing mission to provide you with exceptional heart care, we have created designated Provider Care Teams.  These Care Teams include your primary Cardiologist (physician) and Advanced Practice Providers (APPs -  Physician Assistants and Nurse Practitioners) who all work together to provide you with the care  you need, when you need it. . Your physician recommends that you schedule a follow-up appointment on an as needed basis .   Any Other Special Instructions Will Be Listed Below (If Applicable). FROM A CARDIAC STANDPOINT YOU ARE CLEAR FOR KNEE SURGERY     Studies Ordered:   Orders Placed This Encounter  Procedures  . EKG 12-Lead      Glenetta Hew, M.D., M.S. Interventional Cardiologist   Pager # [phone] Phone # [phone] [address] [place], [place] [address]   Thank you for choosing Heartcare at [facility]!!

## 2018-12-01 NOTE — Patient Instructions (Signed)
Medication Instructions:  Not needed If you need a refill on your cardiac medications before your next appointment, please call your pharmacy.   Lab work: Not needed If you have labs (blood work) drawn today and your tests are completely normal, you will receive your results only by: Marland Kitchen MyChart Message (if you have MyChart) OR . A paper copy in the mail If you have any lab test that is abnormal or we need to change your treatment, we will call you to review the results.  Testing/Procedures: Not needed Follow-Up: At [facility], you and your health needs are our priority.  As part of our continuing mission to provide you with exceptional heart care, we have created designated Provider Care Teams.  These Care Teams include your primary Cardiologist (physician) and Advanced Practice Providers (APPs -  Physician Assistants and Nurse Practitioners) who all work together to provide you with the care you need, when you need it. . Your physician recommends that you schedule a follow-up appointment on an as needed basis .   Any Other Special Instructions Will Be Listed Below (If Applicable). FROM A CARDIAC STANDPOINT YOU ARE CLEAR FOR KNEE SURGERY

## 2018-12-04 ENCOUNTER — Encounter: Payer: Self-pay | Admitting: Cardiology

## 2018-12-04 NOTE — Assessment & Plan Note (Signed)
Very atypical sounding chest discomfort is probably more related to his anxiety and panic attacks.  She is not having anything with exertion which would make it unlikely to be angina.    Would not recommend stress test evaluation, especially before surgery.

## 2018-12-04 NOTE — Assessment & Plan Note (Signed)
PREOPERATIVE CARDIAC RISK ASSESSMENT   Revised Cardiac Risk Index:  High Risk Surgery: no; knee surgery will be considered low risk cardiac standpoint  Defined as Intraperitoneal, intrathoracic or suprainguinal vascular  Active CAD: no; no anginal type symptoms  CHF: no; no PND, orthopnea or edema.  No exertional dyspnea  Cerebrovascular Disease: no;   Diabetes: A1c 6.2-6.4.  Not on meds; On Insulin: no  CKD (Cr >~ 2): no; 0.72  Total: 0 Estimated Risk of Adverse Outcome: LOW RISK Estimated Risk of MI, PE, VF/VT (Cardiac Arrest), Complete Heart Block: <1 %   ACC/AHA Guidelines for "Clearance":  Step 1 - Need for Emergency Surgery: No:   If Yes - go straight to OR with perioperative surveillance  Step 2 - Active Cardiac Conditions (Unstable Angina, Decompensated HF, Significant  Arrhytmias - Complete HB, Mobitz II, Symptomatic VT or SVT, Severe Aortic Stenosis - mean gradient > 40 mmHg, Valve area < 1.0 cm2):   No:   If Yes - Evaluate & Treat per ACC/AHA Guidelines  Step 3 -  Low Risk Surgery: Yes  If Yes --> proceed to OR  If No --> Step 4  Step 4 - Functional Capacity >= 4 METS without symptoms: Yes  If Yes --> proceed to OR  If No --> Step 5  Step 5 --  Clinical Risk Factors (CRF)   3 or more / 1-2 or more CRFs: No: See above  If Yes -- assess Surgical Risk, --   (High Risk Non-cardiac), Intraabdominal or thoracic vascular surgery consider testing if it will change management.  Intermediate Risk: Proceed to OR with HR control, or consider testing if it will change management   No CRFs: Yes  If Yes --> Proceed to OR  Recommend proceeding with the OR procedure without any cardiac evaluation.

## 2018-12-13 IMAGING — MG DIGITAL SCREENING BILATERAL MAMMOGRAM WITH CAD
6 series · 6 of 6 positions shown · non-contrast
Comparison: Previous exam(s).

CLINICAL DATA: Screening.

EXAM:
DIGITAL SCREENING BILATERAL MAMMOGRAM WITH CAD

[L CC]
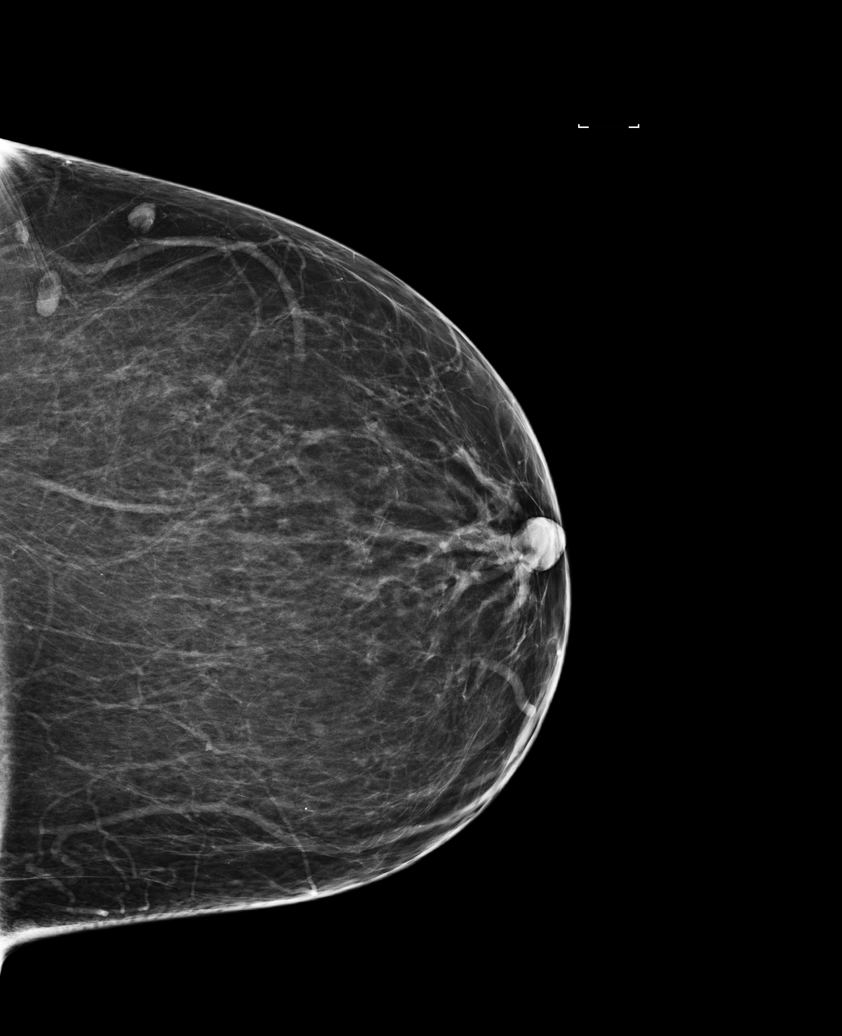

[R CC]
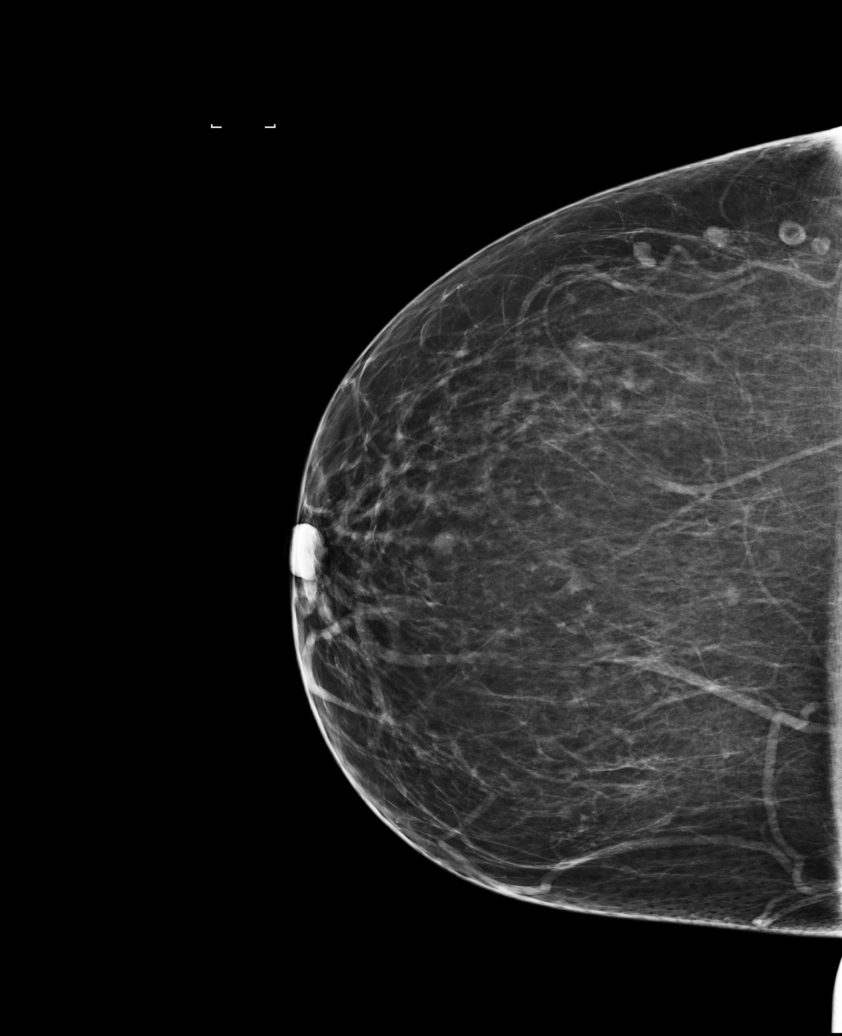

[L MLO (1 of 2)]
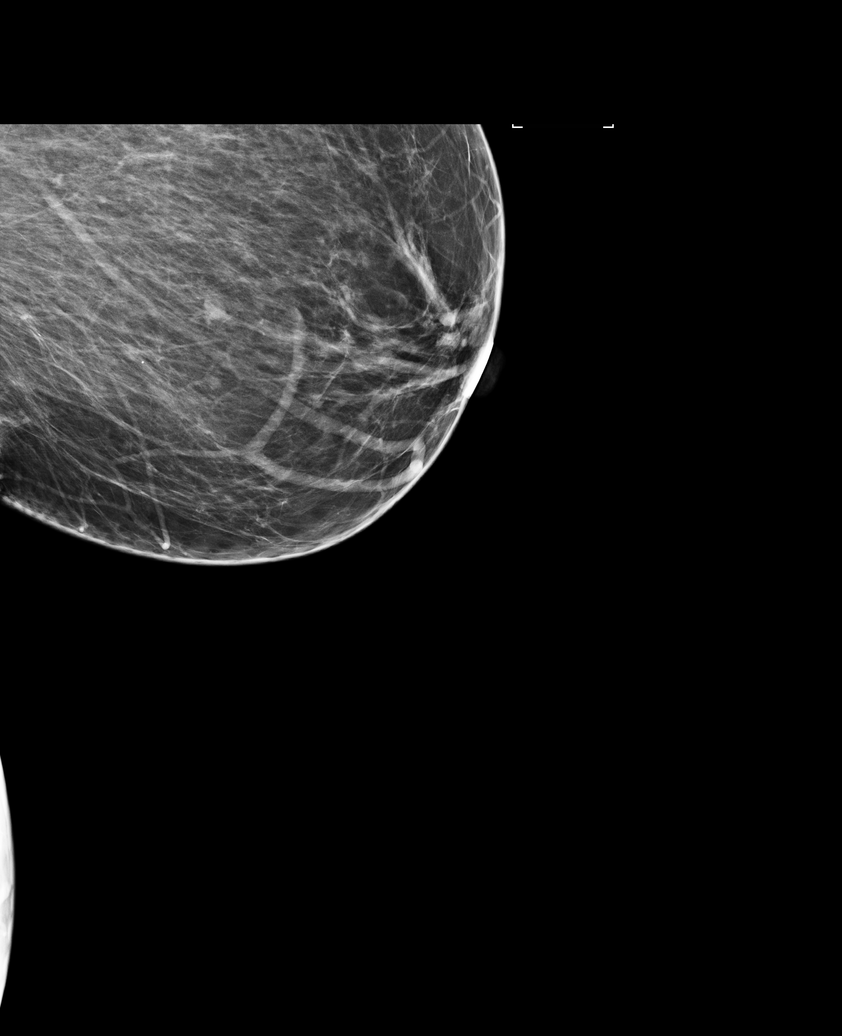

[R MLO (1 of 2)]
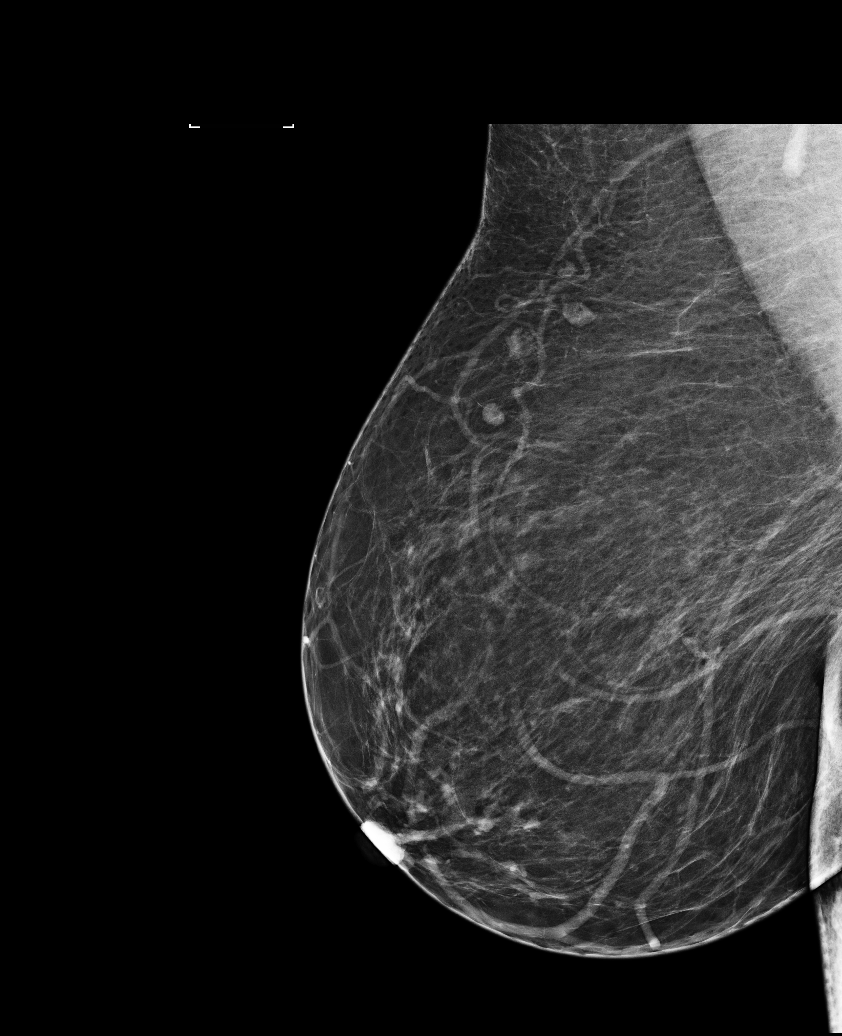

[R MLO (2 of 2)]
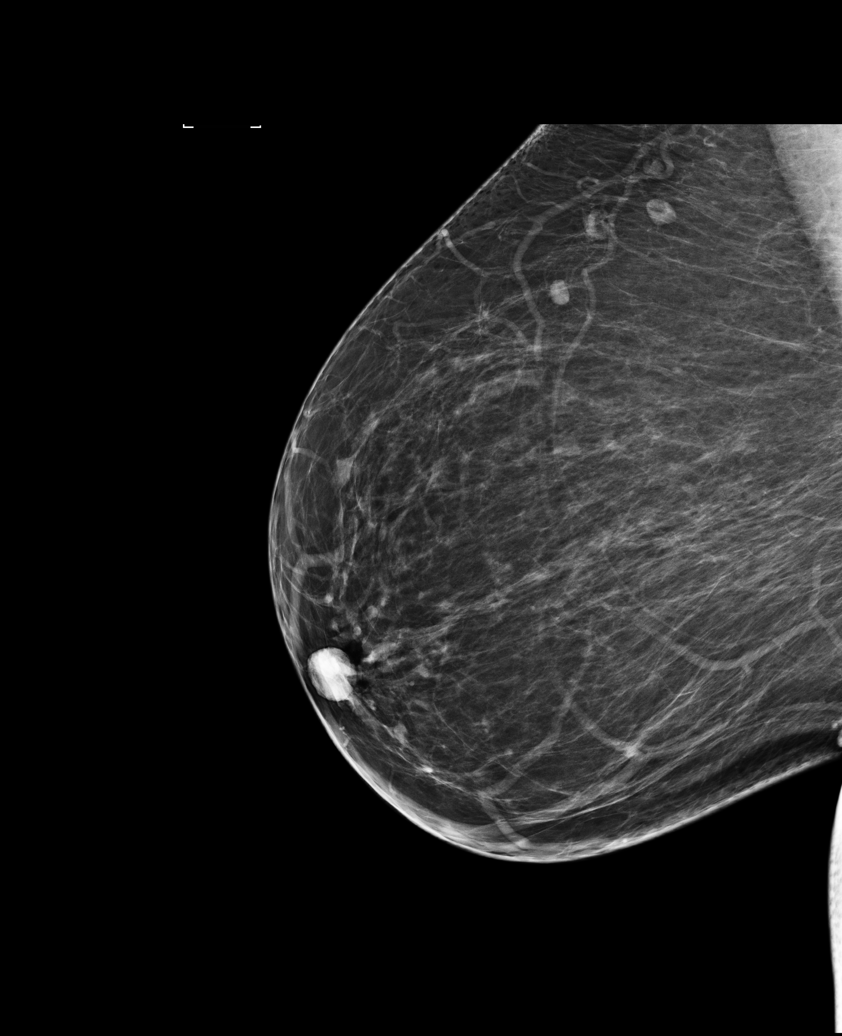

[L MLO (2 of 2)]
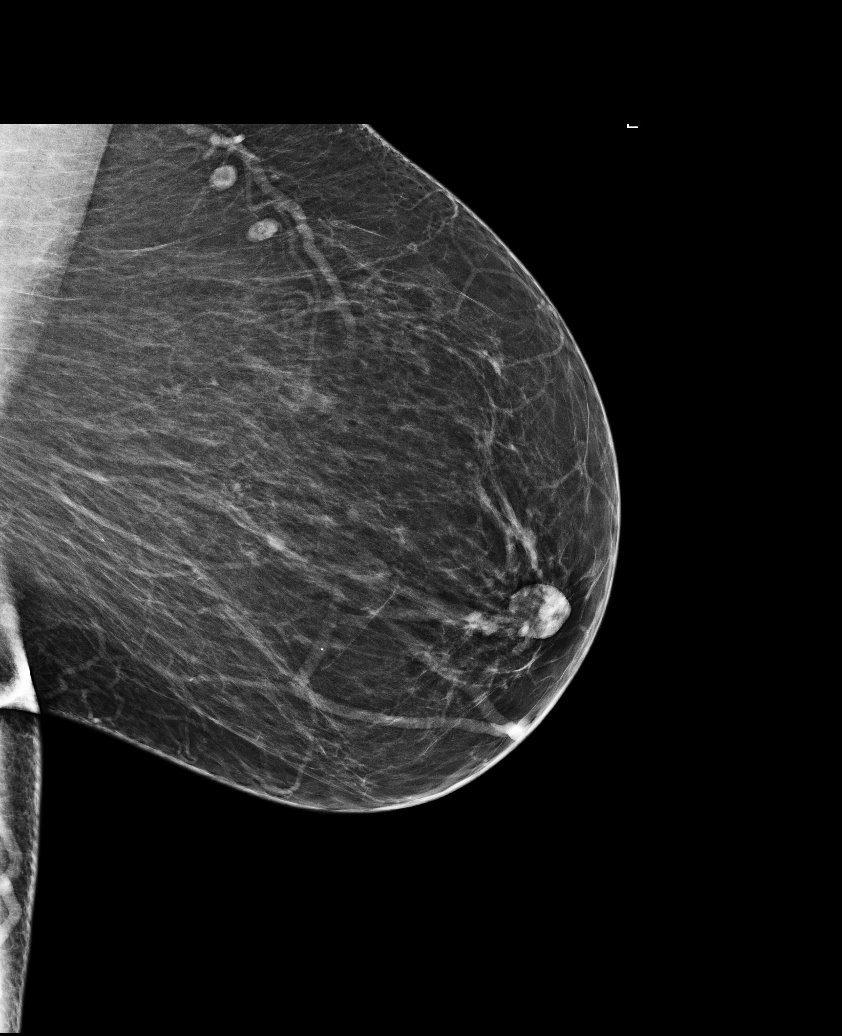

[6 of 6 positions shown; findings below may reference images not displayed]

ACR Breast Density Category b: There are scattered areas of
fibroglandular density.
FINDINGS: There are no findings suspicious for malignancy. Images were
processed with CAD.
IMPRESSION: No mammographic evidence of malignancy. A result letter of this
screening mammogram will be mailed directly to the patient.

RECOMMENDATION:
Screening mammogram in one year. (Code:AS-G-LCT)

BI-RADS CATEGORY  1: Negative.

## 2018-12-20 ENCOUNTER — Encounter (HOSPITAL_COMMUNITY): Payer: Self-pay

## 2018-12-20 ENCOUNTER — Ambulatory Visit (HOSPITAL_COMMUNITY): Admit: 2018-12-20 | Payer: Medicare Other | Admitting: Orthopedic Surgery

## 2018-12-20 SURGERY — ARTHROPLASTY, KNEE, TOTAL
Anesthesia: Choice | Laterality: Left

## 2019-02-23 NOTE — Progress Notes (Signed)
12/01/2018- noted in Epic-EKG

## 2019-02-23 NOTE — Patient Instructions (Addendum)
Jane [name]  02/23/2019    DUE TO COVID-19 NO VISITORS ARE ALLOWED IN THE HOSPITAL.  YOU NEED TO HAVE A COVID 19 TEST ON Today, February 24, 2019 @ 11:15AM, THIS TEST MUST BE DONE BEFORE SURGERY, COME TO [place] ENTRANCE.  MUST SELF QUARANTINE AFTER COVID SWAB PER HANDOUT GIVEN.   Your procedure is scheduled on: Monday 02/28/2019               Report to [facility] Main  Entrance              Report to admitting at  0855  AM                Call this number if you have problems the morning of surgery [phone]    Remember: Do not eat food  :After Midnight.               NO SOLID FOOD AFTER MIDNIGHT THE NIGHT PRIOR TO SURGERY. NOTHING BY MOUTH EXCEPT CLEAR LIQUIDS  0430 am.               PLEASE FINISH ENSURE DRINK PER SURGEON ORDER WHICH NEEDS TO BE COMPLETED AT   0430 am.   CLEAR LIQUID DIET   Foods Allowed                                                                     Foods Excluded  Coffee and tea, regular and decaf                             liquids that you cannot  Plain Jell-O in any flavor                                             see through such as: Fruit ices (not with fruit pulp)                                     milk, soups, orange juice  Iced Popsicles                                    All solid food Carbonated beverages, regular and diet                                    Cranberry, grape and apple juices Sports drinks like Gatorade Lightly seasoned clear broth or consume(fat free) Sugar, honey syrup  Sample Menu Breakfast                                Lunch  Supper Cranberry juice                    Beef broth                            Chicken broth Jell-O                                     Grape juice                           Apple juice Coffee or tea                        Jell-O                                      Popsicle                                                 Coffee or tea                        Coffee or tea  _____________________________________________________________________               BRUSH YOUR TEETH MORNING OF SURGERY AND RINSE YOUR MOUTH OUT, NO CHEWING GUM CANDY OR MINTS.     Take these medicines the morning of surgery with A SIP OF WATER: Paroxetine (Paxiil), Atorvastatin (Lipitor), Bupropion (Wellbutrin XL), use Advair diskus inhaler and use Albuterol inhaler if needed and             bring inhalers with you to the hospital the day of surgery                                 You may not have any metal on your body including hair pins and              piercings  Do not wear jewelry, make-up, lotions, powders or perfumes, deodorant             Do not wear nail polish.  Do not shave  48 hours prior to surgery.                 Do not bring valuables to the hospital. [place].  Contacts, dentures or bridgework may not be worn into surgery.  Leave suitcase in the car. After surgery it may be brought to your room.                  Please read over the following fact sheets you were given: _____________________________________________________________________             [facility] - Preparing for Surgery Before surgery, you can play an important role.  Because skin is not sterile, your skin needs to be as free of germs as possible.  You can reduce the number of germs on your  skin by washing with CHG (chlorahexidine gluconate) soap before surgery.  CHG is an antiseptic cleaner which kills germs and bonds with the skin to continue killing germs even after washing. Please DO NOT use if you have an allergy to CHG or antibacterial soaps.  If your skin becomes reddened/irritated stop using the CHG and inform your nurse when you arrive at Short Stay. Do not shave (including legs and underarms) for at least 48 hours prior to the first CHG shower.  You may shave your  face/neck. Please follow these instructions carefully:  1.  Shower with CHG Soap the night before surgery and the  morning of Surgery.  2.  If you choose to wash your hair, wash your hair first as usual with your  normal  shampoo.  3.  After you shampoo, rinse your hair and body thoroughly to remove the  shampoo.                           4.  Use CHG as you would any other liquid soap.  You can apply chg directly  to the skin and wash                       Gently with a scrungie or clean washcloth.  5.  Apply the CHG Soap to your body ONLY FROM THE NECK DOWN.   Do not use on face/ open                           Wound or open sores. Avoid contact with eyes, ears mouth and genitals (private parts).                       Wash face,  Genitals (private parts) with your normal soap.             6.  Wash thoroughly, paying special attention to the area where your surgery  will be performed.  7.  Thoroughly rinse your body with warm water from the neck down.  8.  DO NOT shower/wash with your normal soap after using and rinsing off  the CHG Soap.                9.  Pat yourself dry with a clean towel.            10.  Wear clean pajamas.            11.  Place clean sheets on your bed the night of your first shower and do not  sleep with pets. Day of Surgery : Do not apply any lotions/deodorants the morning of surgery.  Please wear clean clothes to the hospital/surgery center.  FAILURE TO FOLLOW THESE INSTRUCTIONS MAY RESULT IN THE CANCELLATION OF YOUR SURGERY PATIENT SIGNATURE_________________________________  NURSE SIGNATURE__________________________________  ________________________________________________________________________   Adam Phenix  An incentive spirometer is a tool that can help keep your lungs clear and active. This tool measures how well you are filling your lungs with each breath. Taking long deep breaths may help reverse or decrease the chance of developing breathing  (pulmonary) problems (especially infection) following:  A long period of time when you are unable to move or be active. BEFORE THE PROCEDURE   If the spirometer includes an indicator to show your best effort, your nurse or respiratory therapist will set it to a desired goal.  If possible,  sit up straight or lean slightly forward. Try not to slouch.  Hold the incentive spirometer in an upright position. INSTRUCTIONS FOR USE  1. Sit on the edge of your bed if possible, or sit up as far as you can in bed or on a chair. 2. Hold the incentive spirometer in an upright position. 3. Breathe out normally. 4. Place the mouthpiece in your mouth and seal your lips tightly around it. 5. Breathe in slowly and as deeply as possible, raising the piston or the ball toward the top of the column. 6. Hold your breath for 3-5 seconds or for as long as possible. Allow the piston or ball to fall to the bottom of the column. 7. Remove the mouthpiece from your mouth and breathe out normally. 8. Rest for a few seconds and repeat Steps 1 through 7 at least 10 times every 1-2 hours when you are awake. Take your time and take a few normal breaths between deep breaths. 9. The spirometer may include an indicator to show your best effort. Use the indicator as a goal to work toward during each repetition. 10. After each set of 10 deep breaths, practice coughing to be sure your lungs are clear. If you have an incision (the cut made at the time of surgery), support your incision when coughing by placing a pillow or rolled up towels firmly against it. Once you are able to get out of bed, walk around indoors and cough well. You may stop using the incentive spirometer when instructed by your caregiver.  RISKS AND COMPLICATIONS  Take your time so you do not get dizzy or light-headed.  If you are in pain, you may need to take or ask for pain medication before doing incentive spirometry. It is harder to take a deep breath if you  are having pain. AFTER USE  Rest and breathe slowly and easily.  It can be helpful to keep track of a log of your progress. Your caregiver can provide you with a simple table to help with this. If you are using the spirometer at home, follow these instructions: [place] IF:   You are having difficultly using the spirometer.  You have trouble using the spirometer as often as instructed.  Your pain medication is not giving enough relief while using the spirometer.  You develop fever of 100.5 F (38.1 C) or higher. SEEK IMMEDIATE MEDICAL CARE IF:   You cough up bloody sputum that had not been present before.  You develop fever of 102 F (38.9 C) or greater.  You develop worsening pain at or near the incision site. MAKE SURE YOU:   Understand these instructions.  Will watch your condition.  Will get help right away if you are not doing well or get worse. Document Released: 01/19/2007 Document Revised: 12/01/2011 Document Reviewed: 03/22/2007 Exit[employer] Patient Information 2014 [employer], [place].   ________________________________________________________________________  WHAT IS A BLOOD TRANSFUSION? Blood Transfusion Information  A transfusion is the replacement of blood or some of its parts. Blood is made up of multiple cells which provide different functions.  Red blood cells carry oxygen and are used for blood loss replacement.  White blood cells fight against infection.  Platelets control bleeding.  Plasma helps clot blood.  Other blood products are available for specialized needs, such as hemophilia or other clotting disorders. BEFORE THE TRANSFUSION  Who gives blood for transfusions?   Healthy volunteers who are fully evaluated to make sure their blood is safe. This is  blood bank blood. Transfusion therapy is the safest it has ever been in the practice of medicine. Before blood is taken from a donor, a complete history is taken to make sure that person has  no history of diseases nor engages in risky social behavior (examples are intravenous drug use or sexual activity with multiple partners). The donor's travel history is screened to minimize risk of transmitting infections, such as malaria. The donated blood is tested for signs of infectious diseases, such as HIV and hepatitis. The blood is then tested to be sure it is compatible with you in order to minimize the chance of a transfusion reaction. If you or a relative donates blood, this is often done in anticipation of surgery and is not appropriate for emergency situations. It takes many days to process the donated blood. RISKS AND COMPLICATIONS Although transfusion therapy is very safe and saves many lives, the main dangers of transfusion include:   Getting an infectious disease.  Developing a transfusion reaction. This is an allergic reaction to something in the blood you were given. Every precaution is taken to prevent this. The decision to have a blood transfusion has been considered carefully by your caregiver before blood is given. Blood is not given unless the benefits outweigh the risks. AFTER THE TRANSFUSION  Right after receiving a blood transfusion, you will usually feel much better and more energetic. This is especially true if your red blood cells have gotten low (anemic). The transfusion raises the level of the red blood cells which carry oxygen, and this usually causes an energy increase.  The nurse administering the transfusion will monitor you carefully for complications. HOME CARE INSTRUCTIONS  No special instructions are needed after a transfusion. You may find your energy is better. Speak with your caregiver about any limitations on activity for underlying diseases you may have. SEEK MEDICAL CARE IF:   Your condition is not improving after your transfusion.  You develop redness or irritation at the intravenous (IV) site. SEEK IMMEDIATE MEDICAL CARE IF:  Any of the following  symptoms occur over the next 12 hours:  Shaking chills.  You have a temperature by mouth above 102 F (38.9 C), not controlled by medicine.  Chest, back, or muscle pain.  People around you feel you are not acting correctly or are confused.  Shortness of breath or difficulty breathing.  Dizziness and fainting.  You get a rash or develop hives.  You have a decrease in urine output.  Your urine turns a dark color or changes to pink, red, or brown. Any of the following symptoms occur over the next 10 days:  You have a temperature by mouth above 102 F (38.9 C), not controlled by medicine.  Shortness of breath.  Weakness after normal activity.  The white part of the eye turns yellow (jaundice).  You have a decrease in the amount of urine or are urinating less often.  Your urine turns a dark color or changes to pink, red, or brown. Document Released: 09/05/2000 Document Revised: 12/01/2011 Document Reviewed: 04/24/2008 [facility] Patient Information 2014 [place], [place].  _______________________________________________________________________

## 2019-02-24 ENCOUNTER — Other Ambulatory Visit: Payer: Self-pay

## 2019-02-24 ENCOUNTER — Encounter (HOSPITAL_COMMUNITY)
Admission: RE | Admit: 2019-02-24 | Discharge: 2019-02-24 | Disposition: A | Discharge status: Home / Self Care | Payer: Medicare Other | Source: Ambulatory Visit | Attending: Orthopedic Surgery | Admitting: Orthopedic Surgery

## 2019-02-24 ENCOUNTER — Encounter (HOSPITAL_COMMUNITY): Payer: Self-pay

## 2019-02-24 ENCOUNTER — Other Ambulatory Visit (HOSPITAL_COMMUNITY)
Admission: RE | Admit: 2019-02-24 | Discharge: 2019-02-24 | Disposition: A | Discharge status: Home / Self Care | Payer: Medicare Other | Source: Ambulatory Visit | Attending: Orthopedic Surgery | Admitting: Orthopedic Surgery

## 2019-02-24 DIAGNOSIS — Z1159 Encounter for screening for other viral diseases: Secondary | ICD-10-CM | POA: Diagnosis not present

## 2019-02-24 DIAGNOSIS — Z01812 Encounter for preprocedural laboratory examination: Secondary | ICD-10-CM | POA: Insufficient documentation

## 2019-02-24 HISTORY — DX: Personal history of other diseases of the respiratory system: Z87.09

## 2019-02-24 HISTORY — DX: Pain in right shoulder: M25.511

## 2019-02-24 HISTORY — DX: Personal history of colonic polyps: Z86.010

## 2019-02-24 HISTORY — DX: Prediabetes: R73.03

## 2019-02-24 HISTORY — DX: Personal history of colon polyps, unspecified: Z86.0100

## 2019-02-24 LAB — CBC
HCT: 41.9 % (ref 36.0–46.0)
Hemoglobin: 12.7 g/dL (ref 12.0–15.0)
MCH: 28 pg (ref 26.0–34.0)
MCHC: 30.3 g/dL (ref 30.0–36.0)
MCV: 92.5 fL (ref 80.0–100.0)
Platelets: 248 10*3/uL (ref 150–400)
RBC: 4.53 MIL/uL (ref 3.87–5.11)
RDW: 13 % (ref 11.5–15.5)
WBC: 5.9 10*3/uL (ref 4.0–10.5)
nRBC: 0 % (ref 0.0–0.2)

## 2019-02-24 LAB — COMPREHENSIVE METABOLIC PANEL
ALT: 19 U/L (ref 0–44)
AST: 28 U/L (ref 15–41)
Albumin: 4.7 g/dL (ref 3.5–5.0)
Alkaline Phosphatase: 87 U/L (ref 38–126)
Anion gap: 7 (ref 5–15)
BUN: 21 mg/dL (ref 8–23)
CO2: 28 mmol/L (ref 22–32)
Calcium: 9.2 mg/dL (ref 8.9–10.3)
Chloride: 106 mmol/L (ref 98–111)
Creatinine, Ser: 0.71 mg/dL (ref 0.44–1.00)
GFR calc Af Amer: >60 mL/min (ref >60)
GFR calc non Af Amer: >60 mL/min (ref >60)
Glucose, Bld: 104 mg/dL — ABNORMAL HIGH (ref 70–99)
Potassium: 4.5 mmol/L (ref 3.5–5.1)
Sodium: 141 mmol/L (ref 135–145)
Total Bilirubin: 0.7 mg/dL (ref 0.3–1.2)
Total Protein: 8.3 g/dL — ABNORMAL HIGH (ref 6.5–8.1)

## 2019-02-24 LAB — APTT: aPTT: 32 seconds (ref 24–36)

## 2019-02-24 LAB — HEMOGLOBIN A1C
Hgb A1c MFr Bld: 5.8 % — ABNORMAL HIGH (ref 4.8–5.6)
Mean Plasma Glucose: 119.76 mg/dL

## 2019-02-24 LAB — SURGICAL PCR SCREEN
MRSA, PCR: NEGATIVE
Staphylococcus aureus: NEGATIVE

## 2019-02-24 LAB — PROTIME-INR
INR: 0.9 (ref 0.8–1.2)
Prothrombin Time: 12.5 seconds (ref 11.4–15.2)

## 2019-02-24 LAB — ABO/RH: ABO/RH(D): B POS

## 2019-02-25 LAB — NOVEL CORONAVIRUS, NAA (HOSP ORDER, SEND-OUT TO REF LAB; TAT 18-24 HRS): SARS-CoV-2, NAA: NOT DETECTED

## 2019-02-25 NOTE — H&P (Signed)
TOTAL KNEE ADMISSION H&P  Patient is being admitted for left total knee arthroplasty.  Subjective:  Chief Complaint:left knee pain.  HPI: Jane [name], 68 y.o. female, has a history of pain and functional disability in the left knee due to arthritis and has failed non-surgical conservative treatments for greater than 12 weeks to includeNSAID's and/or analgesics, corticosteriod injections, flexibility and strengthening excercises, use of assistive devices and activity modification.  Onset of symptoms was gradual, starting >10 years ago with gradually worsening course since that time. The patient noted no past surgery on the left knee(s).  Patient currently rates pain in the left knee(s) at 7 out of 10 with activity. Patient has night pain, worsening of pain with activity and weight bearing, pain that interferes with activities of daily living, pain with passive range of motion, crepitus and joint swelling.  Patient has evidence of subchondral sclerosis, periarticular osteophytes, joint subluxation and joint space narrowing by imaging studies.There is no active infection.  Patient Active Problem List   Diagnosis Date Noted  . Preoperative cardiovascular examination 12/01/2018  . Non-cardiac chest pain 12/01/2018  . Rectal cancer ([place]) 06/24/2012  . Allergic rhinitis 01/14/2011  . ASTHMA 11/02/2009  . HYPERTENSION 10/17/2009  . ESOPHAGEAL REFLUX 11/09/2008   Past Medical History:  Diagnosis Date  . Allergic rhinitis   . Anxiety   . Arthritis   . Asthma   . Bilateral shoulder pain   . Cataract    Bilateral  . COPD (chronic obstructive pulmonary disease) ([place])    Pt. denies  . Depression   . GERD (gastroesophageal reflux disease)   . Heart murmur    Diagnosed as a child.  Marland Kitchen History of bronchitis   . History of colon polyps   . Hypertension   . Pre-diabetes   . Rectal cancer ([place])    Dx 2004  . Uterine fibroid 2005    Past Surgical History:  Procedure Laterality Date  .  APPENDECTOMY  2004  . CATARACT EXTRACTION, BILATERAL    . CESAREAN SECTION     x2  . COLONOSCOPY  2019  . LOW ANTERIOR BOWEL RESECTION  2004   For rectal cancer    Current Facility-Administered Medications  Medication Dose Route Frequency Provider Last Rate Last Dose  . 0.9 %  sodium chloride infusion  500 mL Intravenous Once Ladene Artist, MD       Current Outpatient Medications  Medication Sig Dispense Refill Last Dose  . ADVAIR DISKUS 250-50 MCG/DOSE AEPB Inhale 1 puff into the lungs daily.    Taking  . albuterol (VENTOLIN HFA) 108 (90 BASE) MCG/ACT inhaler Inhale 2 puffs into the lungs every 4 (four) hours as needed. (Patient taking differently: Inhale 2 puffs into the lungs every 4 (four) hours as needed for wheezing or shortness of breath. ) 18 g 5 Taking  . APPLE CIDER VINEGAR PO Take 2 tablets by mouth daily.     Marland Kitchen atorvastatin (LIPITOR) 40 MG tablet Take 40 mg by mouth daily.     Marland Kitchen buPROPion (WELLBUTRIN XL) 300 MG 24 hr tablet Take 300 mg by mouth daily.     . calcium carbonate (TUMS - DOSED IN MG ELEMENTAL CALCIUM) 500 MG chewable tablet Chew 1-2 tablets by mouth daily as needed for indigestion or heartburn.     Marland Kitchen HYDROcodone-acetaminophen (NORCO/VICODIN) 5-325 MG tablet Take 1 tablet by mouth daily as needed for moderate pain.     . meloxicam (MOBIC) 15 MG tablet Take 15 mg by mouth  daily.   Taking  . Multiple Vitamin (MULTIVITAMIN) tablet Take 1 tablet by mouth daily.   Taking  . OVER THE COUNTER MEDICATION Apply 1 application topically daily as needed (pain). Hemp oil cream     . PARoxetine (PAXIL) 20 MG tablet Take 30 mg by mouth daily.     Marland Kitchen trolamine salicylate (ASPERCREME) 10 % cream Apply 1 application topically as needed for muscle pain.     . NON FORMULARY Hemp oil      Allergies  Allergen Reactions  . Morphine Itching    CAN TAKE WITH BENDRYL  . Other     Trees, Grass, Rag weed    Social History   Tobacco Use  . Smoking status: Never Smoker  . Smokeless  tobacco: Never Used  Substance Use Topics  . Alcohol use: Yes    Comment: Socially wine and beer     Family History  Problem Relation Age of Onset  . Colon cancer Other        great aunt  . Kidney failure Mother        End-stage renal failure was on dialysis.  . Congestive Heart Failure Mother 65  . CAD Mother   . Hypertension Mother   . Other Father        Unknown  . Healthy Sister   . Prostate cancer Brother   . Multiple myeloma Maternal Grandmother   . Prostate cancer Brother   . Glaucoma Brother   . Healthy Brother   . Stomach cancer Neg Hx   . Breast cancer Neg Hx   . Esophageal cancer Neg Hx   . Liver cancer Neg Hx   . Pancreatic cancer Neg Hx   . Rectal cancer Neg Hx      Review of Systems  Constitutional: Negative.   HENT: Negative.   Eyes: Negative.   Respiratory: Positive for shortness of breath. Negative for cough, hemoptysis, sputum production and wheezing.        SOB with exertion  Cardiovascular: Negative.   Gastrointestinal: Negative.   Genitourinary: Positive for frequency. Negative for dysuria, flank pain, hematuria and urgency.  Musculoskeletal: Positive for joint pain and myalgias. Negative for back pain, falls and neck pain.  Skin: Negative.   Neurological: Negative.   Endo/Heme/Allergies: Positive for environmental allergies. Negative for polydipsia. Does not bruise/bleed easily.  Psychiatric/Behavioral: Positive for depression. Negative for hallucinations, memory loss, substance abuse and suicidal ideas. The patient is not nervous/anxious and does not have insomnia.     Objective:  Physical Exam  Constitutional: She is oriented to person, place, and time. She appears well-developed. No distress.  Morbidly obese  HENT:  Head: Normocephalic and atraumatic.  Right Ear: External ear normal.  Left Ear: External ear normal.  Nose: Nose normal.  Mouth/Throat: Oropharynx is clear and moist.  Eyes: Conjunctivae and EOM are normal.  Neck: Normal  range of motion. Neck supple.  Cardiovascular: Normal rate, regular rhythm, normal heart sounds and intact distal pulses.  No murmur heard. Respiratory: Effort normal and breath sounds normal. No respiratory distress. She has no wheezes.  GI: Soft. Bowel sounds are normal. She exhibits no distension. There is no abdominal tenderness.  Musculoskeletal:     Right hip: Normal.     Left hip: Normal.     Comments: Left Knee Exam: Varus deformity, worse on the left compared to the right. No effusion. No Swelling. Range of motion is 10-100 degrees. Marked crepitus on range of motion of the knee. Positive  medial greater than lateral joint line tenderness Stable knee.  Right Knee Exam: Varus deformity, worse on the left compared to the right. No effusion. No Swelling. Range of motion is 5-100 degrees. Moderate crepitus on range of motion of the knee. Positive medial greater than lateral joint line tenderness. Stable knee.  Neurological: She is alert and oriented to person, place, and time. She has normal strength. No sensory deficit.  Skin: No rash noted. She is not diaphoretic. No erythema.  Psychiatric: She has a normal mood and affect. Her behavior is normal.    Vitals Ht: 5 ft  Wt: 187 lbs BMI: 36.5  BP: 132/78  Pulse: 84 bpm   Imaging Review Plain radiographs demonstrate severe degenerative joint disease of the left knee(s). The overall alignment issignificant varus. The bone quality appears to be good for age and reported activity level.    Assessment/Plan:  End stage primary osteoarthritis, left knee   The patient history, physical examination, clinical judgment of the provider and imaging studies are consistent with end stage degenerative joint disease of the left knee(s) and total knee arthroplasty is deemed medically necessary. The treatment options including medical management, injection therapy arthroscopy and arthroplasty were discussed at length. The risks and  benefits of total knee arthroplasty were presented and reviewed. The risks due to aseptic loosening, infection, stiffness, patella tracking problems, thromboembolic complications and other imponderables were discussed. The patient acknowledged the explanation, agreed to proceed with the plan and consent was signed. Patient is being admitted for inpatient treatment for surgery, pain control, PT, OT, prophylactic antibiotics, VTE prophylaxis, progressive ambulation and ADL's and discharge planning. The patient is planning to be discharged home with outpatient therapy.     Anticipated LOS equal to or greater than 2 midnights due to - Age 92 and older with one or more of the following:  - Obesity  - Expected need for hospital services (PT, OT, Nursing) required for safe  discharge  - Anticipated need for postoperative skilled nursing care or inpatient rehab  - Active co-morbidities: Diabetes and Respiratory Failure/COPD OR   - Unanticipated findings during/Post Surgery: None  - Patient is a high risk of re-admission due to: None    Therapy Plans: Outpatient therapy at [facility] Disposition: Home with daughter Planned DVT Prophylaxis: aspirin '325mg'$  BID DME needed: 3-n-1; has walker PCP: Dr. Kenton Kingfisher TXA: IV Allergies: morphine - itching; codeine causes itching but ok with benadryl Anesthesia Concerns: none BMI: 36.6  Last HgbA1c: 6.2% Other: Hx colon cancer in 2004. Recent atypical chest pain, Stress test on 3/11 normal Instructed patient on which medications to discontinue 5 days prior to surgery. Will follow-up in office with Dr. Wynelle Link 2 weeks post-op.  Ardeen Jourdain, PA-C

## 2019-02-25 NOTE — Anesthesia Preprocedure Evaluation (Addendum)
Anesthesia Evaluation  Patient identified by MRN, date of birth, ID band Patient awake    Reviewed: Allergy & Precautions, NPO status , Patient's Chart, lab work & pertinent test results  History of Anesthesia Complications Negative for: history of anesthetic complications  Airway Mallampati: II  TM Distance: >3 FB Neck ROM: Full    Dental  (+) Missing,    Pulmonary asthma ,    Pulmonary exam normal        Cardiovascular hypertension, Normal cardiovascular exam     Neuro/Psych Anxiety Depression negative neurological ROS     GI/Hepatic Neg liver ROS, GERD  Medicated and Controlled,  Endo/Other  negative endocrine ROS  Renal/GU negative Renal ROS     Musculoskeletal  (+) Arthritis ,   Abdominal   Peds  Hematology negative hematology ROS (+)   Anesthesia Other Findings Day of surgery medications reviewed with the patient.  Reproductive/Obstetrics                           Anesthesia Physical Anesthesia Plan  ASA: II  Anesthesia Plan: Spinal   Post-op Pain Management:  Regional for Post-op pain   Induction:   PONV Risk Score and Plan: 3 and Treatment may vary due to age or medical condition, Ondansetron, Propofol infusion, Dexamethasone and Midazolam  Airway Management Planned: Natural Airway and Simple Face Mask  Additional Equipment:   Intra-op Plan:   Post-operative Plan:   Informed Consent: I have reviewed the patients History and Physical, chart, labs and discussed the procedure including the risks, benefits and alternatives for the proposed anesthesia with the patient or authorized representative who has indicated his/her understanding and acceptance.     Dental advisory given  Plan Discussed with: CRNA  Anesthesia Plan Comments: (See PAT note 02/24/2019, Konrad Felix, PA-C)      Anesthesia Quick Evaluation

## 2019-02-25 NOTE — Progress Notes (Signed)
SPOKE W/  Patient Sherrilyn Rist via phone   SCREENING SYMPTOMS OF COVID 19:   COUGH--asthma / chronic cough-seasonal allergies  RUNNY NOSE--- no  SORE THROAT---no  NASAL CONGESTION----no  SNEEZING----no  SHORTNESS OF BREATH---no  DIFFICULTY BREATHING---no  TEMP >100.0 -----no  UNEXPLAINED BODY ACHES------no  CHILLS -------- no  HEADACHES ---------no  LOSS OF SMELL/ TASTE --------no  HAVE YOU OR ANY FAMILY MEMBER TRAVELLED PAST 14 DAYS OUT OF THE   COUNTY---no STATE----no COUNTRY----no  HAVE YOU OR ANY FAMILY MEMBER BEEN EXPOSED TO ANYONE WITH COVID 19?   no

## 2019-02-25 NOTE — Progress Notes (Signed)
Anesthesia Chart Review   Case:  [id number] Date/Time:  02/28/19 1112   Procedure:  TOTAL KNEE ARTHROPLASTY (Left )   Anesthesia type:  Choice   Pre-op diagnosis:  left knee osteoarthritis   Location:  [facility]OM 09 / [facility]   Surgeon:  Gaynelle Arabian, MD      DISCUSSION: 68 yo never smoker with h/o asthma, COPD, GERD, anxiety, depression, pre-diabetes, left knee OA scheduled for above procedure 02/28/19 with Dr. Gaynelle Arabian.   Pt seen by cardiologist, Dr. Glenetta Hew, for preoperative evaluation due to chest discomfort on 12/01/2018.  Per his note, "I would suggest that her chest pain is noncardiac in nature and does not need a cardiology evaluation prior to having her surgery.  As such, no cardiac evaluation is required with no murmur on exam either.  Would be okay to proceed with surgery without any further cardiac evaluation."  Anticipate pt can proceed with planned procedure barring acute status change.  VS: BP (!) 151/84   Pulse 100   Temp 37.2 C (Oral)   Resp 16   Ht 5' (1.524 m)   Wt 84.8 kg   SpO2 98%   BMI 36.52 kg/m   PROVIDERS: Shirline Frees, MD is PCP    LABS: Labs reviewed: Acceptable for surgery. (all labs ordered are listed, but only abnormal results are displayed)  Labs Reviewed  COMPREHENSIVE METABOLIC PANEL - Abnormal; Notable for the following components:      Result Value   Glucose, Bld 104 (*)    Total Protein 8.3 (*)    All other components within normal limits  HEMOGLOBIN A1C - Abnormal; Notable for the following components:   Hgb A1c MFr Bld 5.8 (*)    All other components within normal limits  SURGICAL PCR SCREEN  APTT  CBC  PROTIME-INR  TYPE AND SCREEN  ABO/RH     IMAGES:   EKG: 12/01/2018 Rate 92 bpm Normal sinus rhythm  Left axis deviation Low voltage QRS  CV:  Past Medical History:  Diagnosis Date  . Allergic rhinitis   . Anxiety   . Arthritis   . Asthma   . Bilateral shoulder pain   . Cataract    Bilateral  . COPD  (chronic obstructive pulmonary disease) ([place])    Pt. denies  . Depression   . GERD (gastroesophageal reflux disease)   . Heart murmur    Diagnosed as a child.  Marland Kitchen History of bronchitis   . History of colon polyps   . Hypertension   . Pre-diabetes   . Rectal cancer ([place])    Dx 2004  . Uterine fibroid 2005    Past Surgical History:  Procedure Laterality Date  . APPENDECTOMY  2004  . CATARACT EXTRACTION, BILATERAL    . CESAREAN SECTION     x2  . COLONOSCOPY  2019  . LOW ANTERIOR BOWEL RESECTION  2004   For rectal cancer    MEDICATIONS: . NON FORMULARY  . ADVAIR DISKUS 250-50 MCG/DOSE AEPB  . albuterol (VENTOLIN HFA) 108 (90 BASE) MCG/ACT inhaler  . APPLE CIDER VINEGAR PO  . atorvastatin (LIPITOR) 40 MG tablet  . buPROPion (WELLBUTRIN XL) 300 MG 24 hr tablet  . calcium carbonate (TUMS - DOSED IN MG ELEMENTAL CALCIUM) 500 MG chewable tablet  . HYDROcodone-acetaminophen (NORCO/VICODIN) 5-325 MG tablet  . meloxicam (MOBIC) 15 MG tablet  . Multiple Vitamin (MULTIVITAMIN) tablet  . OVER THE COUNTER MEDICATION  . PARoxetine (PAXIL) 20 MG tablet  . trolamine salicylate (  ASPERCREME) 10 % cream   . 0.9 %  sodium chloride infusion    Maia Plan [facility] Pre-Surgical Testing [phone] 02/25/19 9:23 AM

## 2019-02-25 NOTE — Progress Notes (Signed)
Called and left message for prescreening COVID.

## 2019-02-27 ENCOUNTER — Encounter (HOSPITAL_COMMUNITY): Payer: Self-pay | Admitting: Anesthesiology

## 2019-02-27 MED ORDER — BUPIVACAINE LIPOSOME 1.3 % IJ SUSP
20.0000 mL | Freq: Once | INTRAMUSCULAR | Status: DC
  Filled 2019-02-27: qty 20

## 2019-02-28 ENCOUNTER — Encounter (HOSPITAL_COMMUNITY)
Admission: RE | Disposition: A | Discharge status: Home / Self Care | Payer: Self-pay | Source: Home / Self Care | Attending: Orthopedic Surgery

## 2019-02-28 ENCOUNTER — Encounter (HOSPITAL_COMMUNITY): Payer: Self-pay | Admitting: *Deleted

## 2019-02-28 ENCOUNTER — Ambulatory Visit (HOSPITAL_COMMUNITY)
Admission: RE | Admit: 2019-02-28 | Discharge: 2019-03-02 | Disposition: A | Discharge status: Home / Self Care | Payer: Medicare Other | Attending: Orthopedic Surgery | Admitting: Orthopedic Surgery

## 2019-02-28 ENCOUNTER — Ambulatory Visit (HOSPITAL_COMMUNITY): Payer: Medicare Other | Admitting: Physician Assistant

## 2019-02-28 ENCOUNTER — Ambulatory Visit (HOSPITAL_COMMUNITY): Payer: Medicare Other | Admitting: Anesthesiology

## 2019-02-28 ENCOUNTER — Other Ambulatory Visit: Payer: Self-pay

## 2019-02-28 DIAGNOSIS — F329 Major depressive disorder, single episode, unspecified: Secondary | ICD-10-CM | POA: Diagnosis not present

## 2019-02-28 DIAGNOSIS — M1712 Unilateral primary osteoarthritis, left knee: Secondary | ICD-10-CM | POA: Insufficient documentation

## 2019-02-28 DIAGNOSIS — F419 Anxiety disorder, unspecified: Secondary | ICD-10-CM | POA: Diagnosis not present

## 2019-02-28 DIAGNOSIS — J45909 Unspecified asthma, uncomplicated: Secondary | ICD-10-CM | POA: Insufficient documentation

## 2019-02-28 DIAGNOSIS — Z85048 Personal history of other malignant neoplasm of rectum, rectosigmoid junction, and anus: Secondary | ICD-10-CM | POA: Insufficient documentation

## 2019-02-28 DIAGNOSIS — I10 Essential (primary) hypertension: Secondary | ICD-10-CM | POA: Diagnosis not present

## 2019-02-28 DIAGNOSIS — E669 Obesity, unspecified: Secondary | ICD-10-CM | POA: Diagnosis not present

## 2019-02-28 DIAGNOSIS — K219 Gastro-esophageal reflux disease without esophagitis: Secondary | ICD-10-CM | POA: Diagnosis not present

## 2019-02-28 DIAGNOSIS — Z791 Long term (current) use of non-steroidal anti-inflammatories (NSAID): Secondary | ICD-10-CM | POA: Diagnosis not present

## 2019-02-28 DIAGNOSIS — Z6836 Body mass index (BMI) 36.0-36.9, adult: Secondary | ICD-10-CM | POA: Insufficient documentation

## 2019-02-28 DIAGNOSIS — Z79899 Other long term (current) drug therapy: Secondary | ICD-10-CM | POA: Diagnosis not present

## 2019-02-28 HISTORY — PX: TOTAL KNEE ARTHROPLASTY: SHX125

## 2019-02-28 LAB — TYPE AND SCREEN
ABO/RH(D): B POS
Antibody Screen: NEGATIVE

## 2019-02-28 SURGERY — ARTHROPLASTY, KNEE, TOTAL
Anesthesia: Spinal | Laterality: Left

## 2019-02-28 MED ORDER — ONDANSETRON HCL 4 MG/2ML IJ SOLN
INTRAMUSCULAR | Status: DC | PRN
  Administered 2019-02-28: 4 mg via INTRAVENOUS

## 2019-02-28 MED ORDER — OXYCODONE HCL 5 MG PO TABS
10.0000 mg | ORAL_TABLET | ORAL | Status: DC | PRN
  Administered 2019-02-28 – 2019-03-01 (×2): 10 mg via ORAL
  Administered 2019-03-01 – 2019-03-02 (×8): 15 mg via ORAL
  Filled 2019-02-28: qty 3
  Filled 2019-02-28: qty 2
  Filled 2019-02-28 (×8): qty 3

## 2019-02-28 MED ORDER — ALUM & MAG HYDROXIDE-SIMETH 200-200-20 MG/5ML PO SUSP
30.0000 mL | Freq: Four times a day (QID) | ORAL | Status: DC | PRN
  Administered 2019-02-28 – 2019-03-01 (×2): 30 mL via ORAL
  Filled 2019-02-28 (×2): qty 30

## 2019-02-28 MED ORDER — DEXAMETHASONE SODIUM PHOSPHATE 10 MG/ML IJ SOLN
INTRAMUSCULAR | Status: AC
  Filled 2019-02-28: qty 1

## 2019-02-28 MED ORDER — PROPOFOL 10 MG/ML IV BOLUS
INTRAVENOUS | Status: AC
  Filled 2019-02-28: qty 60

## 2019-02-28 MED ORDER — FLEET ENEMA 7-19 GM/118ML RE ENEM: 1.0000 | ENEMA | Freq: Once | RECTAL | Status: DC | PRN

## 2019-02-28 MED ORDER — METHOCARBAMOL 500 MG PO TABS
500.0000 mg | ORAL_TABLET | Freq: Four times a day (QID) | ORAL | Status: DC | PRN
  Administered 2019-03-01 – 2019-03-02 (×6): 500 mg via ORAL
  Filled 2019-02-28 (×6): qty 1

## 2019-02-28 MED ORDER — MENTHOL 3 MG MT LOZG: 1.0000 | LOZENGE | OROMUCOSAL | Status: DC | PRN

## 2019-02-28 MED ORDER — DOCUSATE SODIUM 100 MG PO CAPS
100.0000 mg | ORAL_CAPSULE | Freq: Two times a day (BID) | ORAL | Status: DC
  Administered 2019-02-28 – 2019-03-02 (×4): 100 mg via ORAL
  Filled 2019-02-28 (×4): qty 1

## 2019-02-28 MED ORDER — DEXAMETHASONE SODIUM PHOSPHATE 10 MG/ML IJ SOLN
8.0000 mg | Freq: Once | INTRAMUSCULAR | Status: AC
  Administered 2019-02-28: 8 mg via INTRAVENOUS

## 2019-02-28 MED ORDER — ATORVASTATIN CALCIUM 40 MG PO TABS
40.0000 mg | ORAL_TABLET | Freq: Every day | ORAL | Status: DC
  Administered 2019-03-02: 40 mg via ORAL
  Filled 2019-02-28 (×2): qty 1

## 2019-02-28 MED ORDER — GABAPENTIN 300 MG PO CAPS
300.0000 mg | ORAL_CAPSULE | Freq: Three times a day (TID) | ORAL | Status: DC
  Administered 2019-02-28 – 2019-03-02 (×6): 300 mg via ORAL
  Filled 2019-02-28 (×6): qty 1

## 2019-02-28 MED ORDER — METOCLOPRAMIDE HCL 5 MG/ML IJ SOLN: 5.0000 mg | Freq: Three times a day (TID) | INTRAMUSCULAR | Status: DC | PRN

## 2019-02-28 MED ORDER — BUPIVACAINE LIPOSOME 1.3 % IJ SUSP
INTRAMUSCULAR | Status: DC | PRN
  Administered 2019-02-28: 20 mL

## 2019-02-28 MED ORDER — LIDOCAINE 2% (20 MG/ML) 5 ML SYRINGE
INTRAMUSCULAR | Status: AC
  Filled 2019-02-28: qty 5

## 2019-02-28 MED ORDER — ONDANSETRON HCL 4 MG/2ML IJ SOLN
INTRAMUSCULAR | Status: AC
  Filled 2019-02-28: qty 2

## 2019-02-28 MED ORDER — CHLORHEXIDINE GLUCONATE 4 % EX LIQD: 60.0000 mL | Freq: Once | CUTANEOUS | Status: DC

## 2019-02-28 MED ORDER — SODIUM CHLORIDE 0.9 % IR SOLN
Status: DC | PRN
  Administered 2019-02-28: 1000 mL

## 2019-02-28 MED ORDER — LACTATED RINGERS IV SOLN: INTRAVENOUS | Status: DC

## 2019-02-28 MED ORDER — ACETAMINOPHEN 10 MG/ML IV SOLN: 1000.0000 mg | Freq: Once | INTRAVENOUS | Status: DC | PRN

## 2019-02-28 MED ORDER — PROPOFOL 10 MG/ML IV BOLUS
INTRAVENOUS | Status: DC | PRN
  Administered 2019-02-28 (×2): 20 mg via INTRAVENOUS

## 2019-02-28 MED ORDER — POVIDONE-IODINE 10 % EX SWAB: 2.0000 "application " | Freq: Once | CUTANEOUS | Status: DC

## 2019-02-28 MED ORDER — POLYETHYLENE GLYCOL 3350 17 G PO PACK: 17.0000 g | PACK | Freq: Every day | ORAL | Status: DC | PRN

## 2019-02-28 MED ORDER — TRANEXAMIC ACID-NACL 1000-0.7 MG/100ML-% IV SOLN
1000.0000 mg | INTRAVENOUS | Status: AC
  Administered 2019-02-28: 1000 mg via INTRAVENOUS
  Filled 2019-02-28: qty 100

## 2019-02-28 MED ORDER — BUPROPION HCL ER (XL) 300 MG PO TB24
300.0000 mg | ORAL_TABLET | Freq: Every day | ORAL | Status: DC
  Administered 2019-03-02: 300 mg via ORAL
  Filled 2019-02-28 (×2): qty 1

## 2019-02-28 MED ORDER — MOMETASONE FURO-FORMOTEROL FUM 200-5 MCG/ACT IN AERO
2.0000 | INHALATION_SPRAY | Freq: Two times a day (BID) | RESPIRATORY_TRACT | Status: DC
  Administered 2019-02-28 – 2019-03-02 (×4): 2 via RESPIRATORY_TRACT
  Filled 2019-02-28: qty 8.8

## 2019-02-28 MED ORDER — PHENOL 1.4 % MT LIQD: 1.0000 | OROMUCOSAL | Status: DC | PRN

## 2019-02-28 MED ORDER — DIPHENHYDRAMINE HCL 12.5 MG/5ML PO ELIX
12.5000 mg | ORAL_SOLUTION | ORAL | Status: DC | PRN
  Administered 2019-03-01 – 2019-03-02 (×2): 12.5 mg via ORAL
  Filled 2019-02-28 (×2): qty 5

## 2019-02-28 MED ORDER — CEFAZOLIN SODIUM-DEXTROSE 2-4 GM/100ML-% IV SOLN
2.0000 g | Freq: Four times a day (QID) | INTRAVENOUS | Status: AC
  Administered 2019-02-28 – 2019-03-01 (×2): 2 g via INTRAVENOUS
  Filled 2019-02-28 (×2): qty 100

## 2019-02-28 MED ORDER — ONDANSETRON HCL 4 MG/2ML IJ SOLN: 4.0000 mg | Freq: Four times a day (QID) | INTRAMUSCULAR | Status: DC | PRN

## 2019-02-28 MED ORDER — ALBUTEROL SULFATE (2.5 MG/3ML) 0.083% IN NEBU: 2.5000 mg | INHALATION_SOLUTION | RESPIRATORY_TRACT | Status: DC | PRN

## 2019-02-28 MED ORDER — BISACODYL 10 MG RE SUPP: 10.0000 mg | Freq: Every day | RECTAL | Status: DC | PRN

## 2019-02-28 MED ORDER — LACTATED RINGERS IV SOLN
INTRAVENOUS | Status: DC
  Administered 2019-02-28: 10:00:00 via INTRAVENOUS

## 2019-02-28 MED ORDER — RIVAROXABAN 10 MG PO TABS
10.0000 mg | ORAL_TABLET | Freq: Every day | ORAL | Status: DC
  Administered 2019-03-01 – 2019-03-02 (×2): 10 mg via ORAL
  Filled 2019-02-28 (×2): qty 1

## 2019-02-28 MED ORDER — SODIUM CHLORIDE (PF) 0.9 % IJ SOLN
INTRAMUSCULAR | Status: AC
  Filled 2019-02-28: qty 100

## 2019-02-28 MED ORDER — PHENYLEPHRINE 40 MCG/ML (10ML) SYRINGE FOR IV PUSH (FOR BLOOD PRESSURE SUPPORT)
PREFILLED_SYRINGE | INTRAVENOUS | Status: AC
  Filled 2019-02-28: qty 10

## 2019-02-28 MED ORDER — SODIUM CHLORIDE (PF) 0.9 % IJ SOLN
INTRAMUSCULAR | Status: DC | PRN
  Administered 2019-02-28: 60 mL via INTRAVENOUS

## 2019-02-28 MED ORDER — BUPIVACAINE-EPINEPHRINE (PF) 0.5% -1:200000 IJ SOLN
INTRAMUSCULAR | Status: DC | PRN
  Administered 2019-02-28: 15 mL via PERINEURAL

## 2019-02-28 MED ORDER — ONDANSETRON HCL 4 MG PO TABS: 4.0000 mg | ORAL_TABLET | Freq: Four times a day (QID) | ORAL | Status: DC | PRN

## 2019-02-28 MED ORDER — PROPOFOL 500 MG/50ML IV EMUL
INTRAVENOUS | Status: DC | PRN
  Administered 2019-02-28: 50 ug/kg/min via INTRAVENOUS

## 2019-02-28 MED ORDER — METOCLOPRAMIDE HCL 5 MG PO TABS: 5.0000 mg | ORAL_TABLET | Freq: Three times a day (TID) | ORAL | Status: DC | PRN

## 2019-02-28 MED ORDER — BUPIVACAINE IN DEXTROSE 0.75-8.25 % IT SOLN
INTRATHECAL | Status: DC | PRN
  Administered 2019-02-28: 1.6 mL via INTRATHECAL

## 2019-02-28 MED ORDER — PHENYLEPHRINE HCL (PRESSORS) 10 MG/ML IV SOLN
INTRAVENOUS | Status: AC
  Filled 2019-02-28: qty 1

## 2019-02-28 MED ORDER — OXYCODONE HCL 5 MG PO TABS: 5.0000 mg | ORAL_TABLET | Freq: Once | ORAL | Status: DC | PRN

## 2019-02-28 MED ORDER — OXYCODONE HCL 5 MG/5ML PO SOLN: 5.0000 mg | Freq: Once | ORAL | Status: DC | PRN

## 2019-02-28 MED ORDER — PHENYLEPHRINE 40 MCG/ML (10ML) SYRINGE FOR IV PUSH (FOR BLOOD PRESSURE SUPPORT)
PREFILLED_SYRINGE | INTRAVENOUS | Status: DC | PRN
  Administered 2019-02-28: 80 ug via INTRAVENOUS

## 2019-02-28 MED ORDER — MORPHINE SULFATE (PF) 2 MG/ML IV SOLN
0.5000 mg | INTRAVENOUS | Status: DC | PRN
  Administered 2019-03-01: 1 mg via INTRAVENOUS
  Administered 2019-03-01 – 2019-03-02 (×2): 0.5 mg via INTRAVENOUS
  Filled 2019-02-28 (×3): qty 1

## 2019-02-28 MED ORDER — LIDOCAINE 2% (20 MG/ML) 5 ML SYRINGE
INTRAMUSCULAR | Status: DC | PRN
  Administered 2019-02-28: 50 mg via INTRAVENOUS

## 2019-02-28 MED ORDER — MIDAZOLAM HCL 5 MG/5ML IJ SOLN
INTRAMUSCULAR | Status: DC | PRN
  Administered 2019-02-28: 2 mg via INTRAVENOUS

## 2019-02-28 MED ORDER — PROMETHAZINE HCL 25 MG/ML IJ SOLN: 6.2500 mg | INTRAMUSCULAR | Status: DC | PRN

## 2019-02-28 MED ORDER — PAROXETINE HCL 20 MG PO TABS
30.0000 mg | ORAL_TABLET | Freq: Every day | ORAL | Status: DC
  Administered 2019-03-01: 30 mg via ORAL
  Filled 2019-02-28 (×3): qty 1

## 2019-02-28 MED ORDER — ACETAMINOPHEN 500 MG PO TABS
1000.0000 mg | ORAL_TABLET | Freq: Four times a day (QID) | ORAL | Status: AC
  Administered 2019-02-28 – 2019-03-01 (×4): 1000 mg via ORAL
  Filled 2019-02-28 (×4): qty 2

## 2019-02-28 MED ORDER — CEFAZOLIN SODIUM-DEXTROSE 2-4 GM/100ML-% IV SOLN
2.0000 g | INTRAVENOUS | Status: AC
  Administered 2019-02-28: 2 g via INTRAVENOUS
  Filled 2019-02-28: qty 100

## 2019-02-28 MED ORDER — FENTANYL CITRATE (PF) 100 MCG/2ML IJ SOLN
50.0000 ug | INTRAMUSCULAR | Status: DC
  Administered 2019-02-28: 50 ug via INTRAVENOUS
  Filled 2019-02-28: qty 2

## 2019-02-28 MED ORDER — ACETAMINOPHEN 10 MG/ML IV SOLN
1000.0000 mg | Freq: Four times a day (QID) | INTRAVENOUS | Status: DC
  Administered 2019-02-28: 1000 mg via INTRAVENOUS
  Filled 2019-02-28 (×2): qty 100

## 2019-02-28 MED ORDER — MIDAZOLAM HCL 2 MG/2ML IJ SOLN
INTRAMUSCULAR | Status: AC
  Filled 2019-02-28: qty 2

## 2019-02-28 MED ORDER — SODIUM CHLORIDE 0.9 % IV SOLN
INTRAVENOUS | Status: DC | PRN
  Administered 2019-02-28: 40 ug/min via INTRAVENOUS

## 2019-02-28 MED ORDER — FENTANYL CITRATE (PF) 100 MCG/2ML IJ SOLN: 25.0000 ug | INTRAMUSCULAR | Status: DC | PRN

## 2019-02-28 MED ORDER — METHOCARBAMOL 500 MG IVPB - SIMPLE MED
500.0000 mg | Freq: Four times a day (QID) | INTRAVENOUS | Status: DC | PRN
  Filled 2019-02-28: qty 50

## 2019-02-28 MED ORDER — DEXAMETHASONE SODIUM PHOSPHATE 10 MG/ML IJ SOLN
10.0000 mg | Freq: Once | INTRAMUSCULAR | Status: AC
  Administered 2019-03-01: 10:00:00 10 mg via INTRAVENOUS
  Filled 2019-02-28: qty 1

## 2019-02-28 MED ORDER — TRANEXAMIC ACID-NACL 1000-0.7 MG/100ML-% IV SOLN
1000.0000 mg | Freq: Once | INTRAVENOUS | Status: AC
  Administered 2019-02-28: 1000 mg via INTRAVENOUS
  Filled 2019-02-28: qty 100

## 2019-02-28 MED ORDER — MIDAZOLAM HCL 2 MG/2ML IJ SOLN
1.0000 mg | INTRAMUSCULAR | Status: DC
  Administered 2019-02-28 (×2): 1 mg via INTRAVENOUS
  Filled 2019-02-28: qty 2

## 2019-02-28 MED ORDER — SODIUM CHLORIDE 0.9 % IV SOLN
INTRAVENOUS | Status: DC
  Administered 2019-02-28 – 2019-03-01 (×2): via INTRAVENOUS

## 2019-02-28 MED ORDER — STERILE WATER FOR IRRIGATION IR SOLN
Status: DC | PRN
  Administered 2019-02-28 (×2): 1000 mL

## 2019-02-28 MED ORDER — OXYCODONE HCL 5 MG PO TABS
5.0000 mg | ORAL_TABLET | ORAL | Status: DC | PRN
  Administered 2019-02-28: 10 mg via ORAL
  Filled 2019-02-28 (×2): qty 2

## 2019-02-28 SURGICAL SUPPLY — 59 items
ATTUNE PSFEM LTSZ5 NARCEM KNEE (Femur) ×3 IMPLANT
ATTUNE PSRP INSR SZ5 8 KNEE (Insert) ×2 IMPLANT
ATTUNE PSRP INSR SZ5 8MM KNEE (Insert) ×1 IMPLANT
BAG ZIPLOCK 12X15 (MISCELLANEOUS) ×3 IMPLANT
BANDAGE ACE 6X5 VEL STRL LF (GAUZE/BANDAGES/DRESSINGS) ×3 IMPLANT
BASEPLATE TIBIAL ROTATING SZ 4 (Knees) ×3 IMPLANT
BLADE SAG 18X100X1.27 (BLADE) ×3 IMPLANT
BLADE SAW SGTL 11.0X1.19X90.0M (BLADE) ×3 IMPLANT
BLADE SURG SZ10 CARB STEEL (BLADE) ×6 IMPLANT
BOWL SMART MIX CTS (DISPOSABLE) ×3 IMPLANT
CEMENT HV SMART SET (Cement) ×6 IMPLANT
CLOSURE WOUND 1/2 X4 (GAUZE/BANDAGES/DRESSINGS) ×2
COVER SURGICAL LIGHT HANDLE (MISCELLANEOUS) ×3 IMPLANT
COVER WAND RF STERILE (DRAPES) IMPLANT
CUFF TOURN SGL QUICK 34 (TOURNIQUET CUFF) ×2
CUFF TRNQT CYL 34X4.125X (TOURNIQUET CUFF) ×1 IMPLANT
DECANTER SPIKE VIAL GLASS SM (MISCELLANEOUS) ×3 IMPLANT
DRAPE U-SHAPE 47X51 STRL (DRAPES) ×3 IMPLANT
DRSG ADAPTIC 3X8 NADH LF (GAUZE/BANDAGES/DRESSINGS) ×3 IMPLANT
DRSG PAD ABDOMINAL 8X10 ST (GAUZE/BANDAGES/DRESSINGS) ×3 IMPLANT
DURAPREP 26ML APPLICATOR (WOUND CARE) ×3 IMPLANT
ELECT REM PT RETURN 15FT ADLT (MISCELLANEOUS) ×3 IMPLANT
EVACUATOR 1/8 PVC DRAIN (DRAIN) ×3 IMPLANT
GAUZE SPONGE 4X4 12PLY STRL (GAUZE/BANDAGES/DRESSINGS) ×3 IMPLANT
GLOVE BIO SURGEON STRL SZ7 (GLOVE) ×3 IMPLANT
GLOVE BIO SURGEON STRL SZ8 (GLOVE) ×3 IMPLANT
GLOVE BIOGEL PI IND STRL 6.5 (GLOVE) ×1 IMPLANT
GLOVE BIOGEL PI IND STRL 7.0 (GLOVE) ×1 IMPLANT
GLOVE BIOGEL PI IND STRL 8 (GLOVE) ×1 IMPLANT
GLOVE BIOGEL PI INDICATOR 6.5 (GLOVE) ×2
GLOVE BIOGEL PI INDICATOR 7.0 (GLOVE) ×2
GLOVE BIOGEL PI INDICATOR 8 (GLOVE) ×2
GLOVE SURG SS PI 6.5 STRL IVOR (GLOVE) ×3 IMPLANT
GOWN STRL REUS W/TWL LRG LVL3 (GOWN DISPOSABLE) ×9 IMPLANT
HANDPIECE INTERPULSE COAX TIP (DISPOSABLE) ×2
HOLDER FOLEY CATH W/STRAP (MISCELLANEOUS) IMPLANT
IMMOBILIZER KNEE 20 (SOFTGOODS) ×3
IMMOBILIZER KNEE 20 THIGH 36 (SOFTGOODS) ×1 IMPLANT
KIT TURNOVER KIT A (KITS) IMPLANT
MANIFOLD NEPTUNE II (INSTRUMENTS) ×3 IMPLANT
NS IRRIG 1000ML POUR BTL (IV SOLUTION) ×3 IMPLANT
PACK TOTAL KNEE CUSTOM (KITS) ×3 IMPLANT
PADDING CAST COTTON 6X4 STRL (CAST SUPPLIES) ×3 IMPLANT
PATELLA MEDIAL ATTUN 35MM KNEE (Knees) ×3 IMPLANT
PIN STEINMAN FIXATION KNEE (PIN) ×3 IMPLANT
PIN THREADED HEADED SIGMA (PIN) ×3 IMPLANT
PROTECTOR NERVE ULNAR (MISCELLANEOUS) ×3 IMPLANT
SET HNDPC FAN SPRY TIP SCT (DISPOSABLE) ×1 IMPLANT
SPONGE DRAIN TRACH 4X4 STRL 2S (GAUZE/BANDAGES/DRESSINGS) ×3 IMPLANT
STRIP CLOSURE SKIN 1/2X4 (GAUZE/BANDAGES/DRESSINGS) ×4 IMPLANT
SUT MNCRL AB 4-0 PS2 18 (SUTURE) ×3 IMPLANT
SUT STRATAFIX 0 PDS 27 VIOLET (SUTURE) ×3
SUT VIC AB 2-0 CT1 27 (SUTURE) ×6
SUT VIC AB 2-0 CT1 TAPERPNT 27 (SUTURE) ×3 IMPLANT
SUTURE STRATFX 0 PDS 27 VIOLET (SUTURE) ×1 IMPLANT
TRAY FOLEY MTR SLVR 16FR STAT (SET/KITS/TRAYS/PACK) ×3 IMPLANT
WATER STERILE IRR 1000ML POUR (IV SOLUTION) ×6 IMPLANT
WRAP KNEE MAXI GEL POST OP (GAUZE/BANDAGES/DRESSINGS) ×3 IMPLANT
YANKAUER SUCT BULB TIP 10FT TU (MISCELLANEOUS) ×3 IMPLANT

## 2019-02-28 NOTE — Anesthesia Procedure Notes (Signed)
Anesthesia Regional Block: Adductor canal block   Pre-Anesthetic Checklist: ,, timeout performed, Correct Patient, Correct Site, Correct Laterality, Correct Procedure, Correct Position, site marked, Risks and benefits discussed, pre-op evaluation,  At surgeon's request and post-op pain management  Laterality: Left  Prep: Maximum Sterile Barrier Precautions used, chloraprep       Needles:  Injection technique: Single-shot  Needle Type: Echogenic Stimulator Needle     Needle Length: 9cm  Needle Gauge: 22     Additional Needles:   Procedures:,,,, ultrasound used (permanent image in chart),,,,  Narrative:  Start time: 02/28/2019 11:15 AM End time: 02/28/2019 11:17 AM Injection made incrementally with aspirations every 5 mL.  Performed by: Personally  Anesthesiologist: Brennan Bailey, MD  Additional Notes: Risks, benefits, and alternative discussed. Patient gave consent for procedure. Patient prepped and draped in sterile fashion. Sedation administered, patient remains easily responsive to voice. Relevant anatomy identified with ultrasound guidance. Local anesthetic given in 5cc increments with no signs or symptoms of intravascular injection. No pain or paraesthesias with injection. Patient monitored throughout procedure with signs of LAST or immediate complications. Tolerated well. Ultrasound image placed in chart.  Tawny Asal, MD

## 2019-02-28 NOTE — Anesthesia Procedure Notes (Signed)
Spinal  Patient location during procedure: OR Start time: 02/28/2019 11:39 AM End time: 02/28/2019 11:44 AM Staffing Resident/CRNA: Sharlette Dense, CRNA Performed: resident/CRNA  Preanesthetic Checklist Completed: patient identified, site marked, surgical consent, pre-op evaluation, timeout performed, IV checked, risks and benefits discussed and monitors and equipment checked Spinal Block Patient position: sitting Prep: site prepped and draped and DuraPrep Patient monitoring: heart rate, continuous pulse ox and blood pressure Approach: midline Location: L4-5 Injection technique: single-shot Needle Needle type: Sprotte  Needle gauge: 24 G Needle length: 9 cm Additional Notes Kit expiration date 07/23/2019 and lot #[id number] Clear free flow CSF, negative heme, negative paresthesia Tolerated well and returned to supine position

## 2019-02-28 NOTE — Op Note (Signed)
OPERATIVE REPORT-TOTAL KNEE ARTHROPLASTY   Pre-operative diagnosis- Osteoarthritis Left knee(s)  Post-operative diagnosis- Osteoarthritis Left knee(s)  Procedure-  Left Total Knee Arthroplasty  Surgeon- Jane Plover. Kalid Ghan, MD  Assistant- Ardeen Jourdain, PA-C   Anesthesia-  Adductor canal block and spinal  EBL-50 mL   Drains Hemovac  Tourniquet time-  Total Tourniquet Time Documented: Thigh (Left) - 37 minutes Total: Thigh (Left) - 37 minutes     Complications- None  Condition-PACU - hemodynamically stable.   Brief Clinical Note  Jane [name] is a 68 y.o. year old female with end stage OA of her left knee with progressively worsening pain and dysfunction. She has constant pain, with activity and at rest and significant functional deficits with difficulties even with ADLs. She has had extensive non-op management including analgesics, injections of cortisone and viscosupplements, and home exercise program, but remains in significant pain with significant dysfunction. Radiographs show bone on bone arthritis all 3 compartments. She presents now for left Total Knee Arthroplasty.    Procedure in detail---   The patient is brought into the operating room and positioned supine on the operating table. After successful administration of  Adductor canal block and spinal,   a tourniquet is placed high on the  Left thigh(s) and the lower extremity is prepped and draped in the usual sterile fashion. Time out is performed by the operating team and then the  Left lower extremity is wrapped in Esmarch, knee flexed and the tourniquet inflated to 300 mmHg.       A midline incision is made with a ten blade through the subcutaneous tissue to the level of the extensor mechanism. A fresh blade is used to make a medial parapatellar arthrotomy. Soft tissue over the proximal medial tibia is subperiosteally elevated to the joint line with a knife and into the semimembranosus bursa with a Cobb elevator.  Soft tissue over the proximal lateral tibia is elevated with attention being paid to avoiding the patellar tendon on the tibial tubercle. The patella is everted, knee flexed 90 degrees and the ACL and PCL are removed. Findings are bone on bone all 3 compartments with massive global osteophytes.        The drill is used to create a starting hole in the distal femur and the canal is thoroughly irrigated with sterile saline to remove the fatty contents. The 5 degree Left  valgus alignment guide is placed into the femoral canal and the distal femoral cutting block is pinned to remove 9 mm off the distal femur. Resection is made with an oscillating saw.      The tibia is subluxed forward and the menisci are removed. The extramedullary alignment guide is placed referencing proximally at the medial aspect of the tibial tubercle and distally along the second metatarsal axis and tibial crest. The block is pinned to remove 28mm off the more deficient medial  side. Resection is made with an oscillating saw. Size 4is the most appropriate size for the tibia and the proximal tibia is prepared with the modular drill and keel punch for that size.      The femoral sizing guide is placed and size 5 is most appropriate. Rotation is marked off the epicondylar axis and confirmed by creating a rectangular flexion gap at 90 degrees. The size 5 cutting block is pinned in this rotation and the anterior, posterior and chamfer cuts are made with the oscillating saw. The intercondylar block is then placed and that cut is made.  Trial size 4 tibial component, trial size 5 narrow posterior stabilized femur and a 8  mm posterior stabilized rotating platform insert trial is placed. Full extension is achieved with excellent varus/valgus and anterior/posterior balance throughout full range of motion. The patella is everted and thickness measured to be 22  mm. Free hand resection is taken to 12 mm, a 35 template is placed, lug holes are  drilled, trial patella is placed, and it tracks normally. Osteophytes are removed off the posterior femur with the trial in place. All trials are removed and the cut bone surfaces prepared with pulsatile lavage. Cement is mixed and once ready for implantation, the size 4 tibial implant, size  5 narrow posterior stabilized femoral component, and the size 35 patella are cemented in place and the patella is held with the clamp. The trial insert is placed and the knee held in full extension. The Exparel (20 ml mixed with 60 ml saline) is injected into the extensor mechanism, posterior capsule, medial and lateral gutters and subcutaneous tissues.  All extruded cement is removed and once the cement is hard the permanent 8 mm posterior stabilized rotating platform insert is placed into the tibial tray.      The wound is copiously irrigated with saline solution and the extensor mechanism closed over a hemovac drain with #1 V-loc suture. The tourniquet is released for a total tourniquet time of 37  minutes. Flexion against gravity is 140 degrees and the patella tracks normally. Subcutaneous tissue is closed with 2.0 vicryl and subcuticular with running 4.0 Monocryl. The incision is cleaned and dried and steri-strips and a bulky sterile dressing are applied. The limb is placed into a knee immobilizer and the patient is awakened and transported to recovery in stable condition.      Please note that a surgical assistant was a medical necessity for this procedure in order to perform it in a safe and expeditious manner. Surgical assistant was necessary to retract the ligaments and vital neurovascular structures to prevent injury to them and also necessary for proper positioning of the limb to allow for anatomic placement of the prosthesis.   Jane Plover Meggan Dhaliwal, MD    02/28/2019, 12:57 PM

## 2019-02-28 NOTE — Anesthesia Postprocedure Evaluation (Signed)
Anesthesia Post Note  Patient: Jane [name]  Procedure(s) Performed: TOTAL KNEE ARTHROPLASTY (Left )     Patient location during evaluation: PACU Anesthesia Type: Spinal Level of consciousness: awake and alert Pain management: pain level controlled Vital Signs Assessment: post-procedure vital signs reviewed and stable Respiratory status: spontaneous breathing, nonlabored ventilation and respiratory function stable Cardiovascular status: blood pressure returned to baseline and stable Postop Assessment: no apparent nausea or vomiting and spinal receding Anesthetic complications: no    Last Vitals:  Vitals:   02/28/19 1418 02/28/19 1500  BP: 113/71 (!) 142/77  Pulse: 89 89  Resp: 16 18  Temp: 36.8 C 36.8 C  SpO2: 98% 100%    Last Pain:  Vitals:   02/28/19 1500  TempSrc: Oral  PainSc:                  Brennan Bailey

## 2019-02-28 NOTE — Progress Notes (Signed)
Assisted Dr. Howze with left, ultrasound guided, adductor canal block. Side rails up, monitors on throughout procedure. See vital signs in flow sheet. Tolerated Procedure well.  

## 2019-02-28 NOTE — Plan of Care (Signed)
  Problem: Education: Goal: Knowledge of the prescribed therapeutic regimen will improve Outcome: Progressing Goal: Individualized Educational Video(s) Outcome: Progressing   Problem: Activity: Goal: Ability to avoid complications of mobility impairment will improve Outcome: Progressing Goal: Range of joint motion will improve Outcome: Progressing   Problem: Clinical Measurements: Goal: Postoperative complications will be avoided or minimized Outcome: Progressing   Problem: Pain Management: Goal: Pain level will decrease with appropriate interventions Outcome: Progressing   Problem: Skin Integrity: Goal: Will show signs of wound healing Outcome: Progressing   Problem: Education: Goal: Knowledge of General Education information will improve Description Including pain rating scale, medication(s)/side effects and non-pharmacologic comfort measures Outcome: Progressing   Problem: Health Behavior/Discharge Planning: Goal: Ability to manage health-related needs will improve Outcome: Progressing   Problem: Clinical Measurements: Goal: Ability to maintain clinical measurements within normal limits will improve Outcome: Progressing Goal: Will remain free from infection Outcome: Progressing Goal: Diagnostic test results will improve Outcome: Progressing Goal: Respiratory complications will improve Outcome: Progressing Goal: Cardiovascular complication will be avoided Outcome: Progressing   Problem: Activity: Goal: Risk for activity intolerance will decrease Outcome: Progressing   Problem: Nutrition: Goal: Adequate nutrition will be maintained Outcome: Progressing   Problem: Coping: Goal: Level of anxiety will decrease Outcome: Progressing   Problem: Elimination: Goal: Will not experience complications related to bowel motility Outcome: Progressing Goal: Will not experience complications related to urinary retention Outcome: Progressing   Problem: Pain  Managment: Goal: General experience of comfort will improve Outcome: Progressing   Problem: Safety: Goal: Ability to remain free from injury will improve Outcome: Progressing   Problem: Skin Integrity: Goal: Risk for impaired skin integrity will decrease Outcome: Progressing  Plan of care discussed with patient today.

## 2019-02-28 NOTE — Discharge Instructions (Addendum)
° °Dr. Frank Aluisio °Total Joint Specialist °[facility] °[address] °[place], [place] [address] °[phone] ° °TOTAL KNEE REPLACEMENT POSTOPERATIVE DIRECTIONS ° °Knee Rehabilitation, Guidelines Following Surgery  °Results after knee surgery are often greatly improved when you follow the exercise, range of motion and muscle strengthening exercises prescribed by your doctor. Safety measures are also important to protect the knee from further injury. Any time any of these exercises cause you to have increased pain or swelling in your knee joint, decrease the amount until you are comfortable again and slowly increase them. If you have problems or questions, call your caregiver or physical therapist for advice.  ° °HOME CARE INSTRUCTIONS  °• Remove items at home which could result in a fall. This includes throw rugs or furniture in walking pathways.  °· ICE to the affected knee every three hours for 30 minutes at a time and then as needed for pain and swelling.  Continue to use ice on the knee for pain and swelling from surgery. You may notice swelling that will progress down to the foot and ankle.  This is normal after surgery.  Elevate the leg when you are not up walking on it.   °· Continue to use the breathing machine which will help keep your temperature down.  It is common for your temperature to cycle up and down following surgery, especially at night when you are not up moving around and exerting yourself.  The breathing machine keeps your lungs expanded and your temperature down. °· Do not place pillow under knee, focus on keeping the knee straight while resting ° °DIET °You may resume your previous home diet once your are discharged from the hospital. ° °DRESSING / WOUND CARE / SHOWERING °You may change your dressing 3-5 days after surgery.  Then change the dressing every day with sterile gauze.  Please use good hand washing techniques before changing the dressing.  Do not use any lotions  or creams on the incision until instructed by your surgeon. °You may start showering once you are discharged home but do not submerge the incision under water. Just pat the incision dry and apply a dry gauze dressing on daily. °Change the surgical dressing daily and reapply a dry dressing each time. ° °ACTIVITY °Walk with your walker as instructed. °Use walker as long as suggested by your caregivers. °Avoid periods of inactivity such as sitting longer than an hour when not asleep. This helps prevent blood clots.  °You may resume a sexual relationship in one month or when given the OK by your doctor.  °You may return to work once you are cleared by your doctor.  °Do not drive a car for 6 weeks or until released by you surgeon.  °Do not drive while taking narcotics. ° °WEIGHT BEARING °Weight bearing as tolerated with assist device (walker, cane, etc) as directed, use it as long as suggested by your surgeon or therapist, typically at least 4-6 weeks. ° °POSTOPERATIVE CONSTIPATION PROTOCOL °Constipation - defined medically as fewer than three stools per week and severe constipation as less than one stool per week. ° °One of the most common issues patients have following surgery is constipation.  Even if you have a regular bowel pattern at home, your normal regimen is likely to be disrupted due to multiple reasons following surgery.  Combination of anesthesia, postoperative narcotics, change in appetite and fluid intake all can affect your bowels.  In order to avoid complications following surgery, here are some   recommendations in order to help you during your recovery period. ° °Colace (docusate) - Pick up an over-the-counter form of Colace or another stool softener and take twice a day as long as you are requiring postoperative pain medications.  Take with a full glass of water daily.  If you experience loose stools or diarrhea, hold the colace until you stool forms back up.  If your symptoms do not get better within 1  week or if they get worse, check with your doctor. ° °Dulcolax (bisacodyl) - Pick up over-the-counter and take as directed by the product packaging as needed to assist with the movement of your bowels.  Take with a full glass of water.  Use this product as needed if not relieved by Colace only.  ° °MiraLax (polyethylene glycol) - Pick up over-the-counter to have on hand.  MiraLax is a solution that will increase the amount of water in your bowels to assist with bowel movements.  Take as directed and can mix with a glass of water, juice, soda, coffee, or tea.  Take if you go more than two days without a movement. °Do not use MiraLax more than once per day. Call your doctor if you are still constipated or irregular after using this medication for 7 days in a row. ° °If you continue to have problems with postoperative constipation, please contact the office for further assistance and recommendations.  If you experience "the worst abdominal pain ever" or develop nausea or vomiting, please contact the office immediatly for further recommendations for treatment. ° °ITCHING °If you experience itching with your medications, try taking only a single pain pill, or even half a pain pill at a time.  You can also use Benadryl over the counter for itching or also to help with sleep.  ° °TED HOSE STOCKINGS °Wear the elastic stockings on both legs for three weeks following surgery during the day but you may remove then at night for sleeping. ° °MEDICATIONS °See your medication summary on the “After Visit Summary” that the nursing staff will review with you prior to discharge.  You may have some home medications which will be placed on hold until you complete the course of blood thinner medication.  It is important for you to complete the blood thinner medication as prescribed by your surgeon.  Continue your approved medications as instructed at time of discharge. ° °PRECAUTIONS °If you experience chest pain or shortness of breath -  call 911 immediately for transfer to the hospital emergency department.  °If you develop a fever greater that 101 F, purulent drainage from wound, increased redness or drainage from wound, foul odor from the wound/dressing, or calf pain - CONTACT YOUR SURGEON.   °                                                °FOLLOW-UP APPOINTMENTS °Make sure you keep all of your appointments after your operation with your surgeon and caregivers. You should call the office at the above phone number and make an appointment for approximately two weeks after the date of your surgery or on the date instructed by your surgeon outlined in the "After Visit Summary". ° °RANGE OF MOTION AND STRENGTHENING EXERCISES  °Rehabilitation of the knee is important following a knee injury or an operation. After just a few days of immobilization, the muscles of   the thigh which control the knee become weakened and shrink (atrophy). Knee exercises are designed to build up the tone and strength of the thigh muscles and to improve knee motion. Often times heat used for twenty to thirty minutes before working out will loosen up your tissues and help with improving the range of motion but do not use heat for the first two weeks following surgery. These exercises can be done on a training (exercise) mat, on the floor, on a table or on a bed. Use what ever works the best and is most comfortable for you Knee exercises include:  °• Leg Lifts - While your knee is still immobilized in a splint or cast, you can do straight leg raises. Lift the leg to 60 degrees, hold for 3 sec, and slowly lower the leg. Repeat 10-20 times 2-3 times daily. Perform this exercise against resistance later as your knee gets better.  °• Quad and Hamstring Sets - Tighten up the muscle on the front of the thigh (Quad) and hold for 5-10 sec. Repeat this 10-20 times hourly. Hamstring sets are done by pushing the foot backward against an object and holding for 5-10 sec. Repeat as with quad  sets.  °· Leg Slides: Lying on your back, slowly slide your foot toward your buttocks, bending your knee up off the floor (only go as far as is comfortable). Then slowly slide your foot back down until your leg is flat on the floor again. °· Angel Wings: Lying on your back spread your legs to the side as far apart as you can without causing discomfort.  °A rehabilitation program following serious knee injuries can speed recovery and prevent re-injury in the future due to weakened muscles. Contact your doctor or a physical therapist for more information on knee rehabilitation.  ° °IF YOU ARE TRANSFERRED TO A SKILLED REHAB FACILITY °If the patient is transferred to a skilled rehab facility following release from the hospital, a list of the current medications will be sent to the facility for the patient to continue.  When discharged from the skilled rehab facility, please have the facility set up the patient's Home Health Physical Therapy prior to being released. Also, the skilled facility will be responsible for providing the patient with their medications at time of release from the facility to include their pain medication, the muscle relaxants, and their blood thinner medication. If the patient is still at the rehab facility at time of the two week follow up appointment, the skilled rehab facility will also need to assist the patient in arranging follow up appointment in our office and any transportation needs. ° °MAKE SURE YOU:  °• Understand these instructions.  °• Get help right away if you are not doing well or get worse.  ° ° °Pick up stool softner and laxative for home use following surgery while on pain medications. °Do not submerge incision under water. °Please use good hand washing techniques while changing dressing each day. °May shower starting three days after surgery. °Please use a clean towel to pat the incision dry following showers. °Continue to use ice for pain and swelling after surgery. °Do not  use any lotions or creams on the incision until instructed by your surgeon. ° °Information on my medicine - XARELTO® (Rivaroxaban) ° ° °Why was Xarelto® prescribed for you? °Xarelto® was prescribed for you to reduce the risk of blood clots forming after orthopedic surgery. The medical term for these abnormal blood clots is venous thromboembolism (VTE). ° °  What do you need to know about xarelto® ? °Take your Xarelto® ONCE DAILY at the same time every day. °You may take it either with or without food. ° °If you have difficulty swallowing the tablet whole, you may crush it and mix in applesauce just prior to taking your dose. ° °Take Xarelto® exactly as prescribed by your doctor and DO NOT stop taking Xarelto® without talking to the doctor who prescribed the medication.  Stopping without other VTE prevention medication to take the place of Xarelto® may increase your risk of developing a clot. ° °After discharge, you should have regular check-up appointments with your healthcare provider that is prescribing your Xarelto®.   ° °What do you do if you miss a dose? °If you miss a dose, take it as soon as you remember on the same day then continue your regularly scheduled once daily regimen the next day. Do not take two doses of Xarelto® on the same day.  ° °Important Safety Information °A possible side effect of Xarelto® is bleeding. You should call your healthcare provider right away if you experience any of the following: °? Bleeding from an injury or your nose that does not stop. °? Unusual colored urine (red or dark brown) or unusual colored stools (red or black). °? Unusual bruising for unknown reasons. °? A serious fall or if you hit your head (even if there is no bleeding). ° °Some medicines may interact with Xarelto® and might increase your risk of bleeding while on Xarelto®. To help avoid this, consult your healthcare provider or pharmacist prior to using any new prescription or non-prescription medications,  including herbals, vitamins, non-steroidal anti-inflammatory drugs (NSAIDs) and supplements. ° °This website has more information on Xarelto®: www.xarelto.com. ° ° °

## 2019-02-28 NOTE — Evaluation (Signed)
Physical Therapy Evaluation Patient Details Name: Jane [name] MRN: [id number] DOB: Jun 21, 1951 Today's Date: 02/28/2019   History of Present Illness  s/p L TKA   Clinical Impression  Pt is s/p TKA resulting in the deficits listed below (see PT Problem List).  Pt amb ~ 30' with RW and min assist. Wants to d/c Wednesday. 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      Follow Up Recommendations Follow surgeon's recommendation for DC plan and follow-up therapies    Equipment Recommendations  Other (comment)(states wheels missing from walker?)    Recommendations for Other Services       Precautions / Restrictions Precautions Precautions: Fall;Knee Required Braces or Orthoses: Knee Immobilizer - Left Knee Immobilizer - Left: Discontinue once straight leg raise with < 10 degree lag Restrictions Weight Bearing Restrictions: No Other Position/Activity Restrictions: WBAT      Mobility  Bed Mobility Overal bed mobility: Needs Assistance Bed Mobility: Supine to Sit     Supine to sit: Min assist     General bed mobility comments: assist with RLE   Transfers Overall transfer level: Needs assistance Equipment used: Rolling walker (2 wheeled) Transfers: Sit to/from Stand Sit to Stand: Min assist         General transfer comment: assist to rise and transistion to RW, cues for hand placement  Ambulation/Gait Ambulation/Gait assistance: Min assist Gait Distance (Feet): 30 Feet Assistive device: Rolling walker (2 wheeled) Gait Pattern/deviations: Step-to pattern;Antalgic     General Gait Details: cues for sequence, step length  and RW position  Stairs            Wheelchair Mobility    Modified Rankin (Stroke Patients Only)       Balance Overall balance assessment: History of Falls                                            Pertinent Vitals/Pain Pain Assessment: 0-10 Pain Score: 7  Pain Location: left knee Pain Descriptors / Indicators: Burning;Guarding Pain Intervention(s): Limited activity within patient's tolerance;Monitored during session    Home Living Family/patient expects to be discharged to:: Private residence Living Arrangements: Alone Available Help at Discharge: Family Type of Home: House Home Access: Stairs to enter   [employer] of Steps: 1 or 4  and hand rail Home Layout: One level Home Equipment: Other (comment);Bedside commode(pt states her they through her wheels away (?))      Prior Function Level of Independence: Independent               Hand Dominance        Extremity/Trunk Assessment        Lower Extremity Assessment Lower Extremity Assessment: LLE deficits/detail LLE Deficits / Details: ankle WFL, knee and hip grossly 2+/5       Communication   Communication: No difficulties  Cognition Arousal/Alertness: Awake/alert Behavior During Therapy: WFL for tasks assessed/performed Overall Cognitive Status: Within Functional Limits for tasks assessed                                        General Comments  Exercises Total Joint Exercises Ankle Circles/Pumps: AROM;Both;10 reps Quad Sets: AROM;Both;5 reps   Assessment/Plan    PT Assessment Patient needs continued PT services  PT Problem List Decreased strength;Decreased range of motion;Decreased knowledge of use of DME;Decreased mobility;Pain       PT Treatment Interventions Gait training;DME instruction;Functional mobility training;Therapeutic activities;Patient/family education;Therapeutic exercise;Stair training    PT Goals (Current goals can be found in the Care Plan section)  Acute Rehab PT Goals Patient Stated Goal: home on Wednesday PT Goal Formulation: With patient Time For Goal Achievement: 02/28/19 Potential to Achieve  Goals: Good    Frequency 7X/week   Barriers to discharge        Co-evaluation               AM-PAC PT "6 Clicks" Mobility  Outcome Measure Help needed turning from your back to your side while in a flat bed without using bedrails?: A Little Help needed moving from lying on your back to sitting on the side of a flat bed without using bedrails?: A Little Help needed moving to and from a bed to a chair (including a wheelchair)?: A Little Help needed standing up from a chair using your arms (e.g., wheelchair or bedside chair)?: A Little Help needed to walk in hospital room?: A Little Help needed climbing 3-5 steps with a railing? : A Lot 6 Click Score: 17    End of Session Equipment Utilized During Treatment: Gait belt;Left knee immobilizer Activity Tolerance: Patient tolerated treatment well Patient left: in chair;with call bell/phone within reach   PT Visit Diagnosis: Difficulty in walking, not elsewhere classified (R26.2)    Time: 9977-4142 PT Time Calculation (min) (ACUTE ONLY): 34 min   Charges:   PT Evaluation $PT Eval Low Complexity: 1 Low PT Treatments $Gait Training: 8-22 mins        Kenyon Ana, PT  Pager: [phone] Acute Rehab Dept [facility]): [phone]   02/28/2019   [facility] 02/28/2019, 6:59 PM

## 2019-02-28 NOTE — Transfer of Care (Signed)
Immediate Anesthesia Transfer of Care Note  Patient: Jane [name]  Procedure(s) Performed: TOTAL KNEE ARTHROPLASTY (Left )  Patient Location: PACU  Anesthesia Type:Spinal  Level of Consciousness: awake and alert   Airway & Oxygen Therapy: Patient Spontanous Breathing and Patient connected to face mask oxygen  Post-op Assessment: Report given to RN and Post -op Vital signs reviewed and stable  Post vital signs: Reviewed and stable  Last Vitals:  Vitals Value Taken Time  BP 102/63 02/28/2019  1:06 PM  Temp    Pulse 86 02/28/2019  1:07 PM  Resp 21 02/28/2019  1:07 PM  SpO2 100 % 02/28/2019  1:07 PM  Vitals shown include unvalidated device data.  Last Pain:  Vitals:   02/28/19 1129  TempSrc:   PainSc: 0-No pain      Patients Stated Pain Goal: 5 (25/75/05 1833)  Complications: No apparent anesthesia complications

## 2019-02-28 NOTE — Interval H&P Note (Signed)
History and Physical Interval Note:  02/28/2019 9:39 AM  Laurin Coder  has presented today for surgery, with the diagnosis of left knee osteoarthritis.  The various methods of treatment have been discussed with the patient and family. After consideration of risks, benefits and other options for treatment, the patient has consented to  Procedure(s): TOTAL KNEE ARTHROPLASTY (Left) as a surgical intervention.  The patient's history has been reviewed, patient examined, no change in status, stable for surgery.  I have reviewed the patient's chart and labs.  Questions were answered to the patient's satisfaction.     Pilar Plate [place] Lapaglia

## 2019-03-01 ENCOUNTER — Encounter (HOSPITAL_COMMUNITY): Payer: Self-pay | Admitting: Orthopedic Surgery

## 2019-03-01 DIAGNOSIS — M1712 Unilateral primary osteoarthritis, left knee: Secondary | ICD-10-CM | POA: Diagnosis not present

## 2019-03-01 LAB — BASIC METABOLIC PANEL
Anion gap: 9 (ref 5–15)
BUN: 22 mg/dL (ref 8–23)
CO2: 25 mmol/L (ref 22–32)
Calcium: 8.9 mg/dL (ref 8.9–10.3)
Chloride: 102 mmol/L (ref 98–111)
Creatinine, Ser: 0.76 mg/dL (ref 0.44–1.00)
GFR calc Af Amer: >60 mL/min (ref >60)
GFR calc non Af Amer: >60 mL/min (ref >60)
Glucose, Bld: 174 mg/dL — ABNORMAL HIGH (ref 70–99)
Potassium: 3.8 mmol/L (ref 3.5–5.1)
Sodium: 136 mmol/L (ref 135–145)

## 2019-03-01 LAB — CBC
HCT: 36.3 % (ref 36.0–46.0)
Hemoglobin: 11.1 g/dL — ABNORMAL LOW (ref 12.0–15.0)
MCH: 27.8 pg (ref 26.0–34.0)
MCHC: 30.6 g/dL (ref 30.0–36.0)
MCV: 91 fL (ref 80.0–100.0)
Platelets: 215 10*3/uL (ref 150–400)
RBC: 3.99 MIL/uL (ref 3.87–5.11)
RDW: 12.7 % (ref 11.5–15.5)
WBC: 11.5 10*3/uL — ABNORMAL HIGH (ref 4.0–10.5)
nRBC: 0 % (ref 0.0–0.2)

## 2019-03-01 NOTE — Progress Notes (Signed)
   Subjective: 1 Day Post-Op Procedure(s) (LRB): TOTAL KNEE ARTHROPLASTY (Left) Patient reports pain as mild.   Patient seen in rounds by Dr. Wynelle [name]. Patient states she is feeling well, ambulated well with therapy yesterday. No issues overnight. Denies chest pain, SOB, or calf pain. Foley catheter removed this AM.  We will continue therapy today.   Objective: Vital signs in last 24 hours: Temp:  [97.7 F (36.5 C)-98.2 F (36.8 C)] 98.1 F (36.7 C) (06/09 0637) Pulse Rate:  [82-108] 93 (06/09 0637) Resp:  [15-23] 18 (06/09 0637) BP: (99-171)/(54-93) 138/75 (06/09 0637) SpO2:  [98 %-100 %] 99 % (06/09 0637) Weight:  [84.8 kg] 84.8 kg (06/08 0937)  Intake/Output from previous day:  Intake/Output Summary (Last 24 hours) at 03/01/2019 0733 Last data filed at 03/01/2019 0600 Gross per 24 hour  Intake 4780.88 ml  Output 1975 ml  Net 2805.88 ml    Labs: Recent Labs    03/01/19 0330  HGB 11.1*   Recent Labs    03/01/19 0330  WBC 11.5*  RBC 3.99  HCT 36.3  PLT 215   Recent Labs    03/01/19 0330  NA 136  K 3.8  CL 102  CO2 25  BUN 22  CREATININE 0.76  GLUCOSE 174*  CALCIUM 8.9   Exam: General - Patient is Alert and Oriented Extremity - Neurologically intact Neurovascular intact Sensation intact distally Dorsiflexion/Plantar flexion intact Dressing - dressing C/D/I Motor Function - intact, moving foot and toes well on exam.   Past Medical History:  Diagnosis Date  . Allergic rhinitis   . Anxiety   . Arthritis   . Asthma   . Bilateral shoulder pain   . Cataract    Bilateral  . Depression   . GERD (gastroesophageal reflux disease)   . History of bronchitis   . History of colon polyps   . Hypertension   . Pre-diabetes   . Rectal cancer ([place])    Dx 2004  . Uterine fibroid 2005    Assessment/Plan: 1 Day Post-Op Procedure(s) (LRB): TOTAL KNEE ARTHROPLASTY (Left) Active Problems:   Osteoarthritis of left knee  Estimated body mass index is 36.52  kg/m as calculated from the following:   Height as of this encounter: 5' (1.524 m).   Weight as of this encounter: 84.8 kg. Advance diet Up with therapy  Anticipated LOS equal to or greater than 2 midnights due to - Age 68 and older with one or more of the following:  - Obesity  - Expected need for hospital services (PT, OT, Nursing) required for safe  discharge  - Anticipated need for postoperative skilled nursing care or inpatient rehab  - Active co-morbidities: None OR   - Unanticipated findings during/Post Surgery: None  - Patient is a high risk of re-admission due to: None    DVT Prophylaxis - Xarelto Weight bearing as tolerated. D/C O2 and pulse ox and try on room air. Hemovac pulled without difficulty, will continue therapy today.  Plan is to go Home after hospital stay. Plan for discharge tomorrow pending progress with physical therapy.  Theresa Duty, PA-C Orthopedic Surgery 03/01/2019, 7:33 AM

## 2019-03-01 NOTE — Progress Notes (Signed)
Physical Therapy Treatment Patient Details Name: Jane [name] MRN: [id number] DOB: May 16, 1951 Today's Date: 03/01/2019    History of Present Illness s/p L TKA     PT Comments    Pt progressing well with ability to recall several HEP exercises.  Able to complete safe mechanics without cueing with all transitions.  Gait training with RW, cueing for heel strike, posture and equal stride length to improve gait mechanics, no LOB episodes.  EOS pt left in bed with ice on knee, call bell within reach.  Pt educated on benefits and purpose of CPM prior arrival. RN aware of status.   Follow Up Recommendations  Follow surgeon's recommendation for DC plan and follow-up therapies     Equipment Recommendations  Other (comment)(Stated no wheels with new walker)    Recommendations for Other Services       Precautions / Restrictions Precautions Precautions: Fall;Knee Required Braces or Orthoses: Knee Immobilizer - Left Knee Immobilizer - Left: Discontinue once straight leg raise with < 10 degree lag Restrictions Weight Bearing Restrictions: No Other Position/Activity Restrictions: WBAT    Mobility  Bed Mobility               General bed mobility comments: sitting in chair upon therapist entrance  Transfers Overall transfer level: Modified independent Equipment used: Rolling walker (2 wheeled) Transfers: Sit to/from Stand Sit to Stand: Min guard         General transfer comment: Demonstrates good mechanics without cueing required for hand placement  Ambulation/Gait Ambulation/Gait assistance: Min assist Gait Distance (Feet): 100 Feet Assistive device: Rolling walker (2 wheeled) Gait Pattern/deviations: Step-to pattern;Antalgic;Step-through pattern     General Gait Details: cues for sequence, step length  and RW position   Stairs             Wheelchair Mobility    Modified Rankin (Stroke Patients Only)       Balance                                             Cognition Arousal/Alertness: Awake/alert Behavior During Therapy: WFL for tasks assessed/performed Overall Cognitive Status: Within Functional Limits for tasks assessed                                        Exercises Total Joint Exercises Ankle Circles/Pumps: AROM;Both;10 reps;20 reps;Seated Quad Sets: AROM;Left;10 reps Heel Slides: AAROM;Left;10 reps;Supine Long Arc Quad: AROM;Strengthening;Left;10 reps Goniometric ROM: Lt knee AROM 12-73*    General Comments        Pertinent Vitals/Pain Pain Assessment: 0-10 Pain Score: 1  Pain Location: left knee Pain Descriptors / Indicators: Sore;Aching Pain Intervention(s): Monitored during session;Premedicated before session;Ice applied;Repositioned    Home Living                      Prior Function            PT Goals (current goals can now be found in the care plan section)      Frequency    7X/week      PT Plan      Co-evaluation              AM-PAC PT "6 Clicks" Mobility   Outcome Measure  Help needed turning from your back  to your side while in a flat bed without using bedrails?: A Little Help needed moving from lying on your back to sitting on the side of a flat bed without using bedrails?: A Little Help needed moving to and from a bed to a chair (including a wheelchair)?: A Little Help needed standing up from a chair using your arms (e.g., wheelchair or bedside chair)?: A Little Help needed to walk in hospital room?: A Little Help needed climbing 3-5 steps with a railing? : A Lot 6 Click Score: 17    End of Session Equipment Utilized During Treatment: Gait belt;Left knee immobilizer Activity Tolerance: Patient tolerated treatment well Patient left: in bed;with call bell/phone within reach;with SCD's reapplied(Ice applied to knee) Nurse Communication: Mobility status PT Visit Diagnosis: Difficulty in walking, not elsewhere classified (R26.2)      Time: 1450-1520 PT Time Calculation (min) (ACUTE ONLY): 30 min  Charges:  $Gait Training: 8-22 mins $Therapeutic Exercise: 8-22 mins                     [address] LPTA; CBIS [phone]  Aldona Lento 03/01/2019, 4:55 PM

## 2019-03-01 NOTE — Progress Notes (Signed)
Physical Therapy Treatment Patient Details Name: Jane [name] MRN: [id number] DOB: [birth year] Today's Date: 03/01/2019    History of Present Illness s/p L TKA     PT Comments    Pt pleasant, friendly and willing to participate with PT today.  Pt given HEP printout, educated on benefits and therex complete with min cueing initially for proper form with all exercises.  AROM 12-73 degrees.  Gait training with min A, use of RW with initial cueing for step thru sequence and able to ambulate with no LOB episodes.  Instructed stairs with step to pattern with 2 rails, 1 rail as well as sidestep for comfort.  EOS pt left in chair with call bell within reach and ice applied to knee.  RN aware of status.     Follow Up Recommendations  Follow surgeon's recommendation for DC plan and follow-up therapies     Equipment Recommendations  Other (comment)(stated no wheels with walker)    Recommendations for Other Services       Precautions / Restrictions Precautions Precautions: Fall;Knee Required Braces or Orthoses: Knee Immobilizer - Left Knee Immobilizer - Left: Discontinue once straight leg raise with < 10 degree lag Restrictions Weight Bearing Restrictions: No Other Position/Activity Restrictions: WBAT    Mobility  Bed Mobility               General bed mobility comments: sitting in chair upon therapist entrance  Transfers Overall transfer level: Modified independent Equipment used: Rolling walker (2 wheeled) Transfers: Sit to/from Stand Sit to Stand: Min guard         General transfer comment: Cueing for hand placement   Ambulation/Gait Ambulation/Gait assistance: Min assist Gait Distance (Feet): 100 Feet Assistive device: Rolling walker (2 wheeled) Gait Pattern/deviations: Step-to pattern;Antalgic     General Gait Details: cues for sequence, step length  and RW position   Stairs Stairs: Yes Stairs assistance: Min guard Stair Management: Two rails;One rail  Right;Sideways Number of Stairs: 3 General stair comments: Instructed step to pattern ascend/descend with 2 rails then 1 rail; also instructed sideways with 1 rail   Wheelchair Mobility    Modified Rankin (Stroke Patients Only)       Balance                                            Cognition Arousal/Alertness: Awake/alert Behavior During Therapy: WFL for tasks assessed/performed Overall Cognitive Status: Within Functional Limits for tasks assessed                                        Exercises Total Joint Exercises Ankle Circles/Pumps: AROM;Both;10 reps;20 reps;Supine Quad Sets: AROM;Left;10 reps Short Arc Quad: AROM;Strengthening;Left Heel Slides: AAROM;Left;10 reps;Supine Hip ABduction/ADduction: Both;10 reps;Supine Straight Leg Raises: AROM;Strengthening;Left;10 reps Long Arc Quad: AROM;Strengthening;Left;10 reps Goniometric ROM: Lt knee AROM [id number]    General Comments        Pertinent Vitals/Pain Pain Assessment: No/denies pain Pain Location: left knee Pain Intervention(s): Monitored during session;Repositioned;Ice applied    Home Living                      Prior Function            PT Goals (current goals can now be found in the care plan section)  Frequency    7X/week      PT Plan      Co-evaluation              AM-PAC PT "6 Clicks" Mobility   Outcome Measure  Help needed turning from your back to your side while in a flat bed without using bedrails?: A Little Help needed moving from lying on your back to sitting on the side of a flat bed without using bedrails?: A Little Help needed moving to and from a bed to a chair (including a wheelchair)?: A Little Help needed standing up from a chair using your arms (e.g., wheelchair or bedside chair)?: A Little Help needed to walk in hospital room?: A Little Help needed climbing 3-5 steps with a railing? : A Lot 6 Click Score: 17     End of Session Equipment Utilized During Treatment: Gait belt;Left knee immobilizer Activity Tolerance: Patient tolerated treatment well Patient left: in chair;with call bell/phone within reach(Ice applied to knee) Nurse Communication: Mobility status PT Visit Diagnosis: Difficulty in walking, not elsewhere classified (R26.2)     Time: 0258-5277 PT Time Calculation (min) (ACUTE ONLY): 54 min  Charges:  $Gait Training: 8-22 mins $Therapeutic Exercise: 23-37 mins $Therapeutic Activity: 8-22 mins                     [address] LPTA; CBIS [phone]   Aldona Lento 03/01/2019, 11:59 AM

## 2019-03-01 NOTE — Plan of Care (Signed)
  Problem: Education: Goal: Knowledge of the prescribed therapeutic regimen will improve Outcome: Progressing Goal: Individualized Educational Video(s) Outcome: Progressing   Problem: Activity: Goal: Ability to avoid complications of mobility impairment will improve Outcome: Progressing Goal: Range of joint motion will improve Outcome: Progressing   

## 2019-03-02 DIAGNOSIS — M1712 Unilateral primary osteoarthritis, left knee: Secondary | ICD-10-CM | POA: Diagnosis not present

## 2019-03-02 LAB — CBC
HCT: 33.5 % — ABNORMAL LOW (ref 36.0–46.0)
Hemoglobin: 10.5 g/dL — ABNORMAL LOW (ref 12.0–15.0)
MCH: 28.7 pg (ref 26.0–34.0)
MCHC: 31.3 g/dL (ref 30.0–36.0)
MCV: 91.5 fL (ref 80.0–100.0)
Platelets: 203 10*3/uL (ref 150–400)
RBC: 3.66 MIL/uL — ABNORMAL LOW (ref 3.87–5.11)
RDW: 12.8 % (ref 11.5–15.5)
WBC: 12.7 10*3/uL — ABNORMAL HIGH (ref 4.0–10.5)
nRBC: 0 % (ref 0.0–0.2)

## 2019-03-02 LAB — BASIC METABOLIC PANEL
Anion gap: 9 (ref 5–15)
BUN: 18 mg/dL (ref 8–23)
CO2: 28 mmol/L (ref 22–32)
Calcium: 9 mg/dL (ref 8.9–10.3)
Chloride: 101 mmol/L (ref 98–111)
Creatinine, Ser: 0.75 mg/dL (ref 0.44–1.00)
GFR calc Af Amer: >60 mL/min (ref >60)
GFR calc non Af Amer: >60 mL/min (ref >60)
Glucose, Bld: 157 mg/dL — ABNORMAL HIGH (ref 70–99)
Potassium: 4.4 mmol/L (ref 3.5–5.1)
Sodium: 138 mmol/L (ref 135–145)

## 2019-03-02 MED ORDER — METHOCARBAMOL 500 MG PO TABS: 500.0000 mg | ORAL_TABLET | Freq: Four times a day (QID) | ORAL | 0 refills | Status: DC | PRN

## 2019-03-02 MED ORDER — RIVAROXABAN 10 MG PO TABS: 10.0000 mg | ORAL_TABLET | Freq: Every day | ORAL | 0 refills | Status: DC

## 2019-03-02 MED ORDER — OXYCODONE HCL 5 MG PO TABS: 5.0000 mg | ORAL_TABLET | Freq: Four times a day (QID) | ORAL | 0 refills | Status: DC | PRN

## 2019-03-02 MED ORDER — GABAPENTIN 300 MG PO CAPS: 300.0000 mg | ORAL_CAPSULE | Freq: Three times a day (TID) | ORAL | 0 refills | Status: DC

## 2019-03-02 NOTE — Plan of Care (Signed)
Plan of care reviewed and discussed with the patient. 

## 2019-03-02 NOTE — Progress Notes (Addendum)
Physical Therapy Treatment Patient Details Name: Jane [name] MRN: [id number] DOB: 1951-01-19 Today's Date: 03/02/2019    History of Present Illness s/p L TKA     PT Comments    Pt limited by pain and edema present Lt knee this morning.  Pt pre-medicated and monitored pain through session.  Pt able to demonstrate safe mechanics with transfer sit to stands without cueing.  Min guard with gait, continues to require min cueing for posture and to advance LE with heel strike to improve gait mechanics.  Stair training complete with 1 hand to simulate return home, able to demonstrate safe mechanics with step to pattern ascending as well as sidestep for BUE support.  EOS pt left in chair with ice applied to knee.  AROM at 17-80* (decreased extension due to edema).  Pt met all goals for PT.  Reports daughter plans to stop by [facility] on way home and exchange for a RW with wheels.   Follow Up Recommendations  Follow surgeon's recommendation for DC plan and follow-up therapies     Equipment Recommendations  Other (comment)(Stated no wheels on walker)    Recommendations for Other Services       Precautions / Restrictions Precautions Precautions: Fall;Knee Required Braces or Orthoses: Knee Immobilizer - Left Knee Immobilizer - Left: Discontinue once straight leg raise with < 10 degree lag Restrictions Weight Bearing Restrictions: No Other Position/Activity Restrictions: WBAT    Mobility  Bed Mobility               General bed mobility comments: sitting in chair upon therapist entrance  Transfers Overall transfer level: Modified independent Equipment used: Rolling walker (2 wheeled) Transfers: Sit to/from Stand Sit to Stand: Min guard         General transfer comment: Demonstrates good mechanics without cueing required for hand placement  Ambulation/Gait Ambulation/Gait assistance: Min assist Gait Distance (Feet): 100 Feet Assistive device: Rolling walker (2  wheeled) Gait Pattern/deviations: Step-to pattern;Antalgic;Step-through pattern     General Gait Details: cues for sequence, step length  and RW position   Stairs Stairs: Yes Stairs assistance: Min guard Stair Management: Two rails;One rail Right;Sideways Number of Stairs: 3 General stair comments: Reviewed step to pattern mechanics for ascend/descend with 1 rail to simulate return home   Wheelchair Mobility    Modified Rankin (Stroke Patients Only)       Balance                                            Cognition Arousal/Alertness: Awake/alert Behavior During Therapy: WFL for tasks assessed/performed Overall Cognitive Status: Within Functional Limits for tasks assessed                                        Exercises Total Joint Exercises Ankle Circles/Pumps: AROM;Both;10 reps;20 reps;Seated Quad Sets: AROM;Left;10 reps Heel Slides: AAROM;Left;10 reps;Supine Straight Leg Raises: AROM;Strengthening;Left;10 reps Long Arc Quad: AROM;Strengthening;Left;10 reps Goniometric ROM: Lt knee AROM 17-80*    General Comments        Pertinent Vitals/Pain Pain Assessment: 0-10 Pain Score: 7  Pain Location: left knee Pain Descriptors / Indicators: Sore;Aching Pain Intervention(s): Premedicated before session;Monitored during session;Limited activity within patient's tolerance;Repositioned;Ice applied    Home Living  Prior Function            PT Goals (current goals can now be found in the care plan section)      Frequency    7X/week      PT Plan      Co-evaluation              AM-PAC PT "6 Clicks" Mobility   Outcome Measure  Help needed turning from your back to your side while in a flat bed without using bedrails?: A Little Help needed moving from lying on your back to sitting on the side of a flat bed without using bedrails?: A Little Help needed moving to and from a bed to a chair  (including a wheelchair)?: A Little Help needed standing up from a chair using your arms (e.g., wheelchair or bedside chair)?: A Little Help needed to walk in hospital room?: A Little Help needed climbing 3-5 steps with a railing? : A Lot 6 Click Score: 17    End of Session Equipment Utilized During Treatment: Gait belt;Left knee immobilizer Activity Tolerance: Patient tolerated treatment well;Patient limited by pain Patient left: in chair;with call bell/phone within reach;with chair alarm set(Ice applied to knee) Nurse Communication: Mobility status PT Visit Diagnosis: Difficulty in walking, not elsewhere classified (R26.2)     Time: 1012-1050 PT Time Calculation (min) (ACUTE ONLY): 38 min  Charges:  $Gait Training: 23-37 mins $Therapeutic Exercise: 8-22 mins                    [address] LPTA; CBIS [phone]  Aldona Lento 03/02/2019, 12:00 PM

## 2019-03-02 NOTE — Progress Notes (Signed)
   Subjective: 2 Days Post-Op Procedure(s) (LRB): TOTAL KNEE ARTHROPLASTY (Left) Patient reports pain as moderate.   Patient seen in rounds for Dr. Wynelle Link. Patient is well, and has had no acute complaints or problems other than pain in the left knee. Had increase in pain in the knee this AM, most likely due to spinal anesthesia wearing off. Denies chest pain, SOB, or calf pain. Voiding without difficulty, positive flatus. No issues overnight.  Plan is to go Home after hospital stay.  Objective: Vital signs in last 24 hours: Temp:  [97.7 F (36.5 C)-98.4 F (36.9 C)] 98.4 F (36.9 C) (06/10 0639) Pulse Rate:  [91-102] 99 (06/10 0639) Resp:  [14-18] 14 (06/10 0639) BP: (122-174)/(62-96) 141/76 (06/10 0639) SpO2:  [96 %-100 %] 100 % (06/10 0639)  Intake/Output from previous day:  Intake/Output Summary (Last 24 hours) at 03/02/2019 0811 Last data filed at 03/02/2019 0416 Gross per 24 hour  Intake 1032.42 ml  Output 1800 ml  Net -767.58 ml    Labs: Recent Labs    03/01/19 0330 03/02/19 0435  HGB 11.1* 10.5*   Recent Labs    03/01/19 0330 03/02/19 0435  WBC 11.5* 12.7*  RBC 3.99 3.66*  HCT 36.3 33.5*  PLT 215 203   Recent Labs    03/01/19 0330 03/02/19 0435  NA 136 138  K 3.8 4.4  CL 102 101  CO2 25 28  BUN 22 18  CREATININE 0.76 0.75  GLUCOSE 174* 157*  CALCIUM 8.9 9.0   Exam: General - Patient is Alert and Oriented Extremity - Neurologically intact Neurovascular intact Sensation intact distally Dorsiflexion/Plantar flexion intact Dressing/Incision - clean, dry, no drainage Motor Function - intact, moving foot and toes well on exam.   Past Medical History:  Diagnosis Date  . Allergic rhinitis   . Anxiety   . Arthritis   . Asthma   . Bilateral shoulder pain   . Cataract    Bilateral  . Depression   . GERD (gastroesophageal reflux disease)   . History of bronchitis   . History of colon polyps   . Hypertension   . Pre-diabetes   . Rectal cancer  ([place])    Dx 2004  . Uterine fibroid 2005    Assessment/Plan: 2 Days Post-Op Procedure(s) (LRB): TOTAL KNEE ARTHROPLASTY (Left) Active Problems:   Osteoarthritis of left knee  Estimated body mass index is 36.52 kg/m as calculated from the following:   Height as of this encounter: 5' (1.524 m).   Weight as of this encounter: 84.8 kg. Up with therapy D/C IV fluids  DVT Prophylaxis - Xarelto Weight-bearing as tolerated  Plan for discharge to home after one session of physical therapy today as long as pain well controlled with oral medications. Scheduled for outpatient physical therapy at [facility]. Follow-up in the office on June 23rd.  Theresa Duty, PA-C Orthopedic Surgery 03/02/2019, 8:11 AM

## 2019-03-22 ENCOUNTER — Telehealth: Payer: Self-pay | Admitting: *Deleted

## 2019-03-22 NOTE — Telephone Encounter (Signed)
   Primary Cardiologist: Glenetta Hew, MD  Chart reviewed as part of pre-operative protocol coverage. Patient was contacted 03/22/2019 in reference to pre-operative risk assessment for pending surgery as outlined below.  REATHEL TURI was last seen on 12/01/18 by Dr. Ellyn Hack. She complained of atypical chest pain, but no further workup was warranted. Since that day, ELIS RAWLINSON has done well. She underwent left knee surgery on 02/28/19 without cardiac complications. She no longer complains of atypical chest pain, as this was associated with life stressors which she has resolved. Her mobility is limited by her knees, but she goes to rehab three times weekly and denies anginal symptoms.   Therefore, based on ACC/AHA guidelines, the patient would be at acceptable risk for the planned procedure without further cardiovascular testing.    I will route this recommendation to the requesting party via Epic fax function and remove from pre-op pool.  Please call with questions.  Tami Lin Duke, PA 03/22/2019, 2:58 PM

## 2019-03-22 NOTE — Telephone Encounter (Signed)
   [facility] Pre-operative Risk Assessment    Request for surgical clearance:  1. What type of surgery is being performed? Right total knee arthoplasty   2. When is this surgery scheduled? 06/20/19 AT [facility]  3. What type of clearance is required (medical clearance vs. Pharmacy clearance to hold med vs. Both)? BOTH  4. Are there any medications that need to be held prior to surgery and how long? UNKNOWN  5. Practice name and name of physician performing surgery?  [facility]  ; DR Hector Shade  6. What is your office phone number [phone] attn Kelly Hancock   7.   What is your office fax number [phone] attn kelly hancock  8.   Anesthesia type (None, local, MAC, general) ? choice   Raiford Simmonds 03/22/2019, 2:12 PM  _________________________________________________________________   (provider comments below)

## 2019-04-22 ENCOUNTER — Other Ambulatory Visit: Payer: Self-pay | Admitting: Obstetrics and Gynecology

## 2019-04-22 DIAGNOSIS — Z1231 Encounter for screening mammogram for malignant neoplasm of breast: Secondary | ICD-10-CM

## 2019-05-04 ENCOUNTER — Ambulatory Visit: Payer: Medicare Other

## 2019-06-10 NOTE — H&P (Signed)
TOTAL KNEE ADMISSION H&P  Patient is being admitted for right total knee arthroplasty.  Subjective:  Chief Complaint:right knee pain.  HPI: Jane [name], 68 y.o. female, has a history of pain and functional disability in the right knee due to arthritis and has failed non-surgical conservative treatments for greater than 12 weeks to includecorticosteriod injections, viscosupplementation injections and activity modification.  Onset of symptoms was gradual, starting >10 years ago with gradually worsening course since that time. The patient noted no past surgery on the right knee(s).  Patient currently rates pain in the right knee(s) at 7 out of 10 with activity. Patient has worsening of pain with activity and weight bearing and pain that interferes with activities of daily living.  Patient has evidence of severe bone-on-bone arthritis in the medial and patellofemoral compartments with osteophyte formation by imaging studies.There is no active infection.  Patient Active Problem List   Diagnosis Date Noted  . Osteoarthritis of left knee 02/28/2019  . Preoperative cardiovascular examination 12/01/2018  . Non-cardiac chest pain 12/01/2018  . Rectal cancer ([place]) 06/24/2012  . Allergic rhinitis 01/14/2011  . ASTHMA 11/02/2009  . HYPERTENSION 10/17/2009  . ESOPHAGEAL REFLUX 11/09/2008   Past Medical History:  Diagnosis Date  . Allergic rhinitis   . Anxiety   . Arthritis   . Asthma   . Bilateral shoulder pain   . Cataract    Bilateral  . Depression   . GERD (gastroesophageal reflux disease)   . History of bronchitis   . History of colon polyps   . Hypertension   . Pre-diabetes   . Rectal cancer ([place])    Dx 2004  . Uterine fibroid 2005    Past Surgical History:  Procedure Laterality Date  . APPENDECTOMY  2004  . CATARACT EXTRACTION, BILATERAL    . CESAREAN SECTION     x2  . COLONOSCOPY  2019  . LOW ANTERIOR BOWEL RESECTION  2004   For rectal cancer  . TOTAL KNEE ARTHROPLASTY  Left 02/28/2019   Procedure: TOTAL KNEE ARTHROPLASTY;  Surgeon: Gaynelle Arabian, MD;  Location: [facility];  Service: Orthopedics;  Laterality: Left;    No current facility-administered medications for this encounter.    Current Outpatient Medications  Medication Sig Dispense Refill Last Dose  . ADVAIR DISKUS 250-50 MCG/DOSE AEPB Inhale 1 puff into the lungs daily.      Marland Kitchen albuterol (VENTOLIN HFA) 108 (90 BASE) MCG/ACT inhaler Inhale 2 puffs into the lungs every 4 (four) hours as needed. (Patient taking differently: Inhale 2 puffs into the lungs every 4 (four) hours as needed for wheezing or shortness of breath. ) 18 g 5   . atorvastatin (LIPITOR) 40 MG tablet Take 40 mg by mouth daily.     Marland Kitchen buPROPion (WELLBUTRIN XL) 300 MG 24 hr tablet Take 300 mg by mouth daily.     . calcium carbonate (TUMS - DOSED IN MG ELEMENTAL CALCIUM) 500 MG chewable tablet Chew 1-2 tablets by mouth daily as needed for indigestion or heartburn.     . gabapentin (NEURONTIN) 300 MG capsule Take 1 capsule (300 mg total) by mouth 3 (three) times daily. Take a 300 mg capsule three times a day for two weeks following surgery.Then take a 300 mg capsule two times a day for two weeks. Then take a 300 mg capsule once a day for two weeks. Then discontinue. 84 capsule 0   . methocarbamol (ROBAXIN) 500 MG tablet Take 1 tablet (500 mg total) by mouth every 6 (six)  hours as needed for muscle spasms. 40 tablet 0   . oxyCODONE (OXY IR/ROXICODONE) 5 MG immediate release tablet Take 1-2 tablets (5-10 mg total) by mouth every 6 (six) hours as needed for moderate pain (pain score 4-6). 56 tablet 0   . PARoxetine (PAXIL) 20 MG tablet Take 30 mg by mouth daily.     . rivaroxaban (XARELTO) 10 MG TABS tablet Take 1 tablet (10 mg total) by mouth daily with breakfast for 19 days. Then take one 81 mg aspirin once a day for three weeks. Then discontinue aspirin. 19 tablet 0   . trolamine salicylate (ASPERCREME) 10 % cream Apply 1 application topically as  needed for muscle pain.      Allergies  Allergen Reactions  . Morphine Itching    CAN TAKE WITH BENDRYL  . Other     Trees, Grass, Rag weed    Social History   Tobacco Use  . Smoking status: Never Smoker  . Smokeless tobacco: Never Used  Substance Use Topics  . Alcohol use: Yes    Comment: Socially wine and beer     Family History  Problem Relation Age of Onset  . Colon cancer Other        great aunt  . Kidney failure Mother        End-stage renal failure was on dialysis.  . Congestive Heart Failure Mother 58  . CAD Mother   . Hypertension Mother   . Other Father        Unknown  . Healthy Sister   . Prostate cancer Brother   . Multiple myeloma Maternal Grandmother   . Prostate cancer Brother   . Glaucoma Brother   . Healthy Brother   . Stomach cancer Neg Hx   . Breast cancer Neg Hx   . Esophageal cancer Neg Hx   . Liver cancer Neg Hx   . Pancreatic cancer Neg Hx   . Rectal cancer Neg Hx      Review of Systems  Constitutional: Negative for chills and fever.  Cardiovascular: Negative for chest pain and palpitations.  Gastrointestinal: Negative for nausea and vomiting.  Musculoskeletal: Positive for joint pain.  Neurological: Negative for tingling and sensory change.     Objective:  Physical Exam Well nourished and well developed. General: Alert and oriented x3, cooperative and pleasant, no acute distress. Head: normocephalic, atraumatic, neck supple. Eyes: EOMI. Respiratory: breath sounds clear in all fields, no wheezing, rales, or rhonchi. Cardiovascular: Regular rate and rhythm, no murmurs, gallops or rubs.  Abdomen: non-tender to palpation and soft, normoactive bowel sounds.  Musculoskeletal: Right Knee Exam:  Varus deformity, worse on the left compared to the right. No effusion. No Swelling. Range of motion is 5-100 degrees.  Moderate crepitus on range of motion of the knee.  Positive medial greater than lateral joint line tenderness.  Stable  knee.  Calves soft and nontender. Motor function intact in LE. Strength 5/5 LE bilaterally. Neuro: Distal pulses 2+. Sensation to light touch intact in LE.  Vital signs in last 24 hours: Labs:  Estimated body mass index is 36.52 kg/m as calculated from the following:   Height as of 02/28/19: 5' (1.524 m).   Weight as of 02/28/19: 84.8 kg.   Imaging Review Plain radiographs demonstrate severe degenerative joint disease of the right knee(s). The overall alignment isneutral. The bone quality appears to be adequate for age and reported activity level.  Assessment/Plan:  End stage arthritis, right knee   The patient history,  physical examination, clinical judgment of the provider and imaging studies are consistent with end stage degenerative joint disease of the right knee(s) and total knee arthroplasty is deemed medically necessary. The treatment options including medical management, injection therapy arthroscopy and arthroplasty were discussed at length. The risks and benefits of total knee arthroplasty were presented and reviewed. The risks due to aseptic loosening, infection, stiffness, patella tracking problems, thromboembolic complications and other imponderables were discussed. The patient acknowledged the explanation, agreed to proceed with the plan and consent was signed. Patient is being admitted for inpatient treatment for surgery, pain control, PT, OT, prophylactic antibiotics, VTE prophylaxis, progressive ambulation and ADL's and discharge planning. The patient is planning to be discharged home.  Therapy Plans: outpatient therapy at [facility]  Disposition: Home with daughter Planned DVT Prophylaxis: Xarelto 63m daily (hx of colon cancer) DME needed: none PCP: Dr. HKenton Kingfisher clearance received  Cardiologist: Dr. HEllyn HackTXA: IV Allergies: morphine - itching Anesthesia Concerns: none BMI: 36.1  Other: Hx of colon cancer in 2004, s/p resection. Oxycodone did not do well after left  TKA, plan to use Dilaudid after surgery.   Patient's anticipated LOS is less than 2 midnights, meeting these requirements: - Lives within 1 hour of care - Has a competent adult at home to recover with post-op recover - NO history of  - Chronic pain requiring opiods  - Diabetes  - Coronary Artery Disease  - Heart failure  - Heart attack  - Stroke  - DVT/VTE  - Cardiac arrhythmia  - Respiratory Failure/COPD  - Renal failure  - Anemia  - Advanced Liver disease  - Patient was instructed on what medications to stop prior to surgery. - Follow-up visit in 2 weeks with Dr. AWynelle Link- Begin physical therapy following surgery - Pre-operative lab work as pre-surgical testing - Prescriptions will be provided in hospital at time of discharge  AGriffith Citron PA-C Orthopedic Surgery [facility] [place]

## 2019-06-10 NOTE — Patient Instructions (Addendum)
DUE TO COVID-19 ONLY ONE VISITOR IS ALLOWED TO COME WITH YOU AND STAY IN THE WAITING ROOM ONLY DURING PRE OP AND PROCEDURE DAY OF SURGERY. THE 1 VISITOR MAY VISIT WITH YOU AFTER SURGERY IN YOUR PRIVATE ROOM DURING VISITING HOURS ONLY!  YOU NEED TO HAVE A COVID 19 TEST ON__THURSDAY 9-24-2020_____ @____8 :30AM___, THIS TEST MUST BE DONE BEFORE SURGERY, COME  [address], [place] , [address].  ([place]) ONCE YOUR COVID TEST IS COMPLETED, PLEASE BEGIN THE QUARANTINE INSTRUCTIONS AS OUTLINED IN YOUR HANDOUT.                Gretha Andriola Trotti   Your procedure is scheduled on: 06-20-19    Report to [facility] Main  Entrance    Report to Short Stay  At 5:50 AM     Call this number if you have problems the morning of surgery [phone]    Remember: NO SOLID FOOD AFTER MIDNIGHT THE NIGHT PRIOR TO SURGERY. YOU MAY DRINK CLEAR LIQUIDS UNTIL 5:20 AM . PLEASE FINISH ENSURE DRINK PER SURGEON ORDER  WHICH NEEDS TO BE COMPLETED AT 5:20 AM.   CLEAR LIQUID DIET   Foods Allowed                                                                     Foods Excluded  Coffee and tea, regular and decaf                             liquids that you cannot  Plain Jell-O any favor except red or purple                                           see through such as: Fruit ices (not with fruit pulp)                                     milk, soups, orange juice  Iced Popsicles                                    All solid food Carbonated beverages, regular and diet                                    Cranberry, grape and apple juices Sports drinks like Gatorade Lightly seasoned clear broth or consume(fat free) Sugar, honey syrup  Sample Menu Breakfast                                Lunch                                     Supper Cranberry juice  Beef broth                            Chicken broth Jell-O                                     Grape juice                            Apple juice Coffee or tea                        Jell-O                                      Popsicle                                                Coffee or tea                        Coffee or tea  _____________________________________________________________________       Take these medicines the morning of surgery with A SIP OF WATER: Atorvastatin (Lipitor), Bupropion (Wellbutrin) and Paroxetine (Paxil)  BRUSH YOUR TEETH MORNING OF SURGERY AND RINSE YOUR MOUTH OUT, NO CHEWING GUM CANDY OR MINTS.                                 You may not have any metal on your body including hair pins and              piercings     Do not wear jewelry, make-up, lotions, powders or perfumes, deodorant             Do not wear nail polish.  Do not shave  48 hours prior to surgery.             Do not bring valuables to the hospital. [place].  Contacts, dentures or bridgework may not be worn into surgery.  Leave suitcase in the car. After surgery it may be brought to your room.   Special Instructions: N/A              Please read over the following fact sheets you were given: _____________________________________________________________________             [facility] - Preparing for Surgery Before surgery, you can play an important role.  Because skin is not sterile, your skin needs to be as free of germs as possible.  You can reduce the number of germs on your skin by washing with CHG (chlorahexidine gluconate) soap before surgery.  CHG is an antiseptic cleaner which kills germs and bonds with the skin to continue killing germs even after washing. Please DO NOT use if you have an allergy to CHG or antibacterial soaps.  If your skin becomes reddened/irritated stop using the CHG and inform your nurse when you arrive at Short Stay. Do not  shave (including legs and underarms) for at least 48 hours prior to the first CHG shower.  You  may shave your face/neck. Please follow these instructions carefully:  1.  Shower with CHG Soap the night before surgery and the  morning of Surgery.  2.  If you choose to wash your hair, wash your hair first as usual with your  normal  shampoo.  3.  After you shampoo, rinse your hair and body thoroughly to remove the  shampoo.                           4.  Use CHG as you would any other liquid soap.  You can apply chg directly  to the skin and wash                       Gently with a scrungie or clean washcloth.  5.  Apply the CHG Soap to your body ONLY FROM THE NECK DOWN.   Do not use on face/ open                           Wound or open sores. Avoid contact with eyes, ears mouth and genitals (private parts).                       Wash face,  Genitals (private parts) with your normal soap.             6.  Wash thoroughly, paying special attention to the area where your surgery  will be performed.  7.  Thoroughly rinse your body with warm water from the neck down.  8.  DO NOT shower/wash with your normal soap after using and rinsing off  the CHG Soap.                9.  Pat yourself dry with a clean towel.            10.  Wear clean pajamas.            11.  Place clean sheets on your bed the night of your first shower and do not  sleep with pets. Day of Surgery : Do not apply any lotions/deodorants the morning of surgery.  Please wear clean clothes to the hospital/surgery center.  FAILURE TO FOLLOW THESE INSTRUCTIONS MAY RESULT IN THE CANCELLATION OF YOUR SURGERY PATIENT SIGNATURE_________________________________  NURSE SIGNATURE__________________________________  ________________________________________________________________________   Adam Phenix  An incentive spirometer is a tool that can help keep your lungs clear and active. This tool measures how well you are filling your lungs with each breath. Taking long deep breaths may help reverse or decrease the chance of  developing breathing (pulmonary) problems (especially infection) following:  A long period of time when you are unable to move or be active. BEFORE THE PROCEDURE   If the spirometer includes an indicator to show your best effort, your nurse or respiratory therapist will set it to a desired goal.  If possible, sit up straight or lean slightly forward. Try not to slouch.  Hold the incentive spirometer in an upright position. INSTRUCTIONS FOR USE  1. Sit on the edge of your bed if possible, or sit up as far as you can in bed or on a chair. 2. Hold the incentive spirometer in an upright position. 3. Breathe out normally. 4. Place the mouthpiece in your  mouth and seal your lips tightly around it. 5. Breathe in slowly and as deeply as possible, raising the piston or the ball toward the top of the column. 6. Hold your breath for 3-5 seconds or for as long as possible. Allow the piston or ball to fall to the bottom of the column. 7. Remove the mouthpiece from your mouth and breathe out normally. 8. Rest for a few seconds and repeat Steps 1 through 7 at least 10 times every 1-2 hours when you are awake. Take your time and take a few normal breaths between deep breaths. 9. The spirometer may include an indicator to show your best effort. Use the indicator as a goal to work toward during each repetition. 10. After each set of 10 deep breaths, practice coughing to be sure your lungs are clear. If you have an incision (the cut made at the time of surgery), support your incision when coughing by placing a pillow or rolled up towels firmly against it. Once you are able to get out of bed, walk around indoors and cough well. You may stop using the incentive spirometer when instructed by your caregiver.  RISKS AND COMPLICATIONS  Take your time so you do not get dizzy or light-headed.  If you are in pain, you may need to take or ask for pain medication before doing incentive spirometry. It is harder to take a  deep breath if you are having pain. AFTER USE  Rest and breathe slowly and easily.  It can be helpful to keep track of a log of your progress. Your caregiver can provide you with a simple table to help with this. If you are using the spirometer at home, follow these instructions: [place] IF:   You are having difficultly using the spirometer.  You have trouble using the spirometer as often as instructed.  Your pain medication is not giving enough relief while using the spirometer.  You develop fever of 100.5 F (38.1 C) or higher. SEEK IMMEDIATE MEDICAL CARE IF:   You cough up bloody sputum that had not been present before.  You develop fever of 102 F (38.9 C) or greater.  You develop worsening pain at or near the incision site. MAKE SURE YOU:   Understand these instructions.  Will watch your condition.  Will get help right away if you are not doing well or get worse. Document Released: 01/19/2007 Document Revised: 12/01/2011 Document Reviewed: 03/22/2007 ExitCare Patient Information 2014 ExitCare, [place].   ________________________________________________________________________  WHAT IS A BLOOD TRANSFUSION? Blood Transfusion Information  A transfusion is the replacement of blood or some of its parts. Blood is made up of multiple cells which provide different functions.  Red blood cells carry oxygen and are used for blood loss replacement.  White blood cells fight against infection.  Platelets control bleeding.  Plasma helps clot blood.  Other blood products are available for specialized needs, such as hemophilia or other clotting disorders. BEFORE THE TRANSFUSION  Who gives blood for transfusions?   Healthy volunteers who are fully evaluated to make sure their blood is safe. This is blood bank blood. Transfusion therapy is the safest it has ever been in the practice of medicine. Before blood is taken from a donor, a complete history is taken to make  sure that person has no history of diseases nor engages in risky social behavior (examples are intravenous drug use or sexual activity with multiple partners). The donor's travel history is screened to minimize risk of  transmitting infections, such as malaria. The donated blood is tested for signs of infectious diseases, such as HIV and hepatitis. The blood is then tested to be sure it is compatible with you in order to minimize the chance of a transfusion reaction. If you or a relative donates blood, this is often done in anticipation of surgery and is not appropriate for emergency situations. It takes many days to process the donated blood. RISKS AND COMPLICATIONS Although transfusion therapy is very safe and saves many lives, the main dangers of transfusion include:   Getting an infectious disease.  Developing a transfusion reaction. This is an allergic reaction to something in the blood you were given. Every precaution is taken to prevent this. The decision to have a blood transfusion has been considered carefully by your caregiver before blood is given. Blood is not given unless the benefits outweigh the risks. AFTER THE TRANSFUSION  Right after receiving a blood transfusion, you will usually feel much better and more energetic. This is especially true if your red blood cells have gotten low (anemic). The transfusion raises the level of the red blood cells which carry oxygen, and this usually causes an energy increase.  The nurse administering the transfusion will monitor you carefully for complications. HOME CARE INSTRUCTIONS  No special instructions are needed after a transfusion. You may find your energy is better. Speak with your caregiver about any limitations on activity for underlying diseases you may have. SEEK MEDICAL CARE IF:   Your condition is not improving after your transfusion.  You develop redness or irritation at the intravenous (IV) site. SEEK IMMEDIATE MEDICAL CARE IF:   Any of the following symptoms occur over the next 12 hours:  Shaking chills.  You have a temperature by mouth above 102 F (38.9 C), not controlled by medicine.  Chest, back, or muscle pain.  People around you feel you are not acting correctly or are confused.  Shortness of breath or difficulty breathing.  Dizziness and fainting.  You get a rash or develop hives.  You have a decrease in urine output.  Your urine turns a dark color or changes to pink, red, or brown. Any of the following symptoms occur over the next 10 days:  You have a temperature by mouth above 102 F (38.9 C), not controlled by medicine.  Shortness of breath.  Weakness after normal activity.  The white part of the eye turns yellow (jaundice).  You have a decrease in the amount of urine or are urinating less often.  Your urine turns a dark color or changes to pink, red, or brown. Document Released: 09/05/2000 Document Revised: 12/01/2011 Document Reviewed: 04/24/2008 [facility] Patient Information 2014 [place], [place].  _______________________________________________________________________

## 2019-06-10 NOTE — Progress Notes (Signed)
05-21-19 Surgical Clearance from Dr. Aline Brochure on chart  03-22-19 (Epic)  Cardiac Clearance from Fabian Sharp, [place]   12-01-18 [facility]) EKG

## 2019-06-14 ENCOUNTER — Encounter (HOSPITAL_COMMUNITY): Payer: Self-pay

## 2019-06-14 ENCOUNTER — Encounter (HOSPITAL_COMMUNITY)
Admission: RE | Admit: 2019-06-14 | Discharge: 2019-06-14 | Disposition: A | Discharge status: Home / Self Care | Payer: Medicare Other | Source: Ambulatory Visit | Attending: Orthopedic Surgery | Admitting: Orthopedic Surgery

## 2019-06-14 ENCOUNTER — Other Ambulatory Visit: Payer: Self-pay

## 2019-06-14 DIAGNOSIS — M1711 Unilateral primary osteoarthritis, right knee: Secondary | ICD-10-CM | POA: Diagnosis not present

## 2019-06-14 DIAGNOSIS — Z01812 Encounter for preprocedural laboratory examination: Secondary | ICD-10-CM | POA: Diagnosis present

## 2019-06-14 LAB — PROTIME-INR
INR: 1 (ref 0.8–1.2)
Prothrombin Time: 13.4 seconds (ref 11.4–15.2)

## 2019-06-14 LAB — COMPREHENSIVE METABOLIC PANEL
ALT: 16 U/L (ref 0–44)
AST: 21 U/L (ref 15–41)
Albumin: 4.6 g/dL (ref 3.5–5.0)
Alkaline Phosphatase: 89 U/L (ref 38–126)
Anion gap: 8 (ref 5–15)
BUN: 29 mg/dL — ABNORMAL HIGH (ref 8–23)
CO2: 27 mmol/L (ref 22–32)
Calcium: 9.7 mg/dL (ref 8.9–10.3)
Chloride: 105 mmol/L (ref 98–111)
Creatinine, Ser: 0.81 mg/dL (ref 0.44–1.00)
GFR calc Af Amer: >60 mL/min (ref >60)
GFR calc non Af Amer: >60 mL/min (ref >60)
Glucose, Bld: 94 mg/dL (ref 70–99)
Potassium: 4.5 mmol/L (ref 3.5–5.1)
Sodium: 140 mmol/L (ref 135–145)
Total Bilirubin: 0.8 mg/dL (ref 0.3–1.2)
Total Protein: 7.8 g/dL (ref 6.5–8.1)

## 2019-06-14 LAB — CBC
HCT: 41.4 % (ref 36.0–46.0)
Hemoglobin: 12.6 g/dL (ref 12.0–15.0)
MCH: 26.7 pg (ref 26.0–34.0)
MCHC: 30.4 g/dL (ref 30.0–36.0)
MCV: 87.7 fL (ref 80.0–100.0)
Platelets: 242 10*3/uL (ref 150–400)
RBC: 4.72 MIL/uL (ref 3.87–5.11)
RDW: 13.4 % (ref 11.5–15.5)
WBC: 3.9 10*3/uL — ABNORMAL LOW (ref 4.0–10.5)
nRBC: 0 % (ref 0.0–0.2)

## 2019-06-14 LAB — SURGICAL PCR SCREEN
MRSA, PCR: NEGATIVE
Staphylococcus aureus: NEGATIVE

## 2019-06-14 LAB — APTT: aPTT: 32 seconds (ref 24–36)

## 2019-06-14 NOTE — Progress Notes (Signed)
   06/14/19 1022  OBSTRUCTIVE SLEEP APNEA  Have you ever been diagnosed with sleep apnea through a sleep study? No  Do you snore loudly (loud enough to be heard through closed doors)?  1  Do you often feel tired, fatigued, or sleepy during the daytime (such as falling asleep during driving or talking to someone)? 0  Has anyone observed you stop breathing during your sleep? 1  Do you have, or are you being treated for high blood pressure? 1  BMI more than 35 kg/m2? 1  Age > 30 (1-yes) 1  Female Gender (Yes=1) 0  Obstructive Sleep Apnea Score 5

## 2019-06-14 NOTE — Progress Notes (Signed)
PCP - Shirline Frees, MD Cardiologist - Leonie Man, MD  Chest x-ray -  EKG - Epic 2020 Stress Test -  ECHO -  Cardiac Cath -   Sleep Study -  CPAP -   Fasting Blood Sugar -  Checks Blood Sugar _____ times a day  Blood Thinner Instructions: Aspirin Instructions: Last Dose:  Anesthesia review:  Patient c/o of a filling on left lower jaw that has chipped and is now rubbing on side of tongue which has led to development of a sore on tongue. Patient denies drainage or foul taste in mouth.  APP made aware and assessed patient oral cavity at PAT appt today . Per APP discussion with patient , no signs of loose teeth or apparent signs of infection therefore no major concerns with proceeding with surgery as scheduled. Patient advised to f/u with PCP or dentist to report this and obtain advise on how to care for sore to enable healing. Patient agrees to contact provider today .   CARDIAC CLEARANCE 03-22-2019 Epic TELE NOTE  SURGICAL CLEARANCE DR. HARRIS ON CHART   Patient denies shortness of breath, fever, cough and chest pain at PAT appointment   Patient verbalized understanding of instructions that were given to them at the PAT appointment. Patient was also instructed that they will need to review over the PAT instructions again at home before surgery.

## 2019-06-15 LAB — HEMOGLOBIN A1C
Hgb A1c MFr Bld: 6.1 % — ABNORMAL HIGH (ref 4.8–5.6)
Mean Plasma Glucose: 128 mg/dL

## 2019-06-16 ENCOUNTER — Other Ambulatory Visit (HOSPITAL_COMMUNITY)
Admission: RE | Admit: 2019-06-16 | Discharge: 2019-06-16 | Disposition: A | Discharge status: Home / Self Care | Payer: Medicare Other | Source: Ambulatory Visit | Attending: Orthopedic Surgery | Admitting: Orthopedic Surgery

## 2019-06-16 DIAGNOSIS — Z01812 Encounter for preprocedural laboratory examination: Secondary | ICD-10-CM | POA: Insufficient documentation

## 2019-06-16 DIAGNOSIS — Z20828 Contact with and (suspected) exposure to other viral communicable diseases: Secondary | ICD-10-CM | POA: Diagnosis not present

## 2019-06-16 NOTE — Progress Notes (Signed)
Anesthesia Chart Review   Case: [id number] Date/Time: 06/20/19 0805   Procedure: TOTAL KNEE ARTHROPLASTY (Right ) - 60min   Anesthesia type: Choice   Pre-op diagnosis: right knee osteoarthritis   Location: [facility] ROOM 10 / [facility]   Surgeon: Gaynelle Arabian, MD      DISCUSSION:68 y.o. never smoker with h/o asthma, HTN, GERD, pre-diabetes, right knee OA scheduled for above procedure 06/20/2019 with Dr. Gaynelle Arabian.   Cleared by cardiology 03/22/2019.  Per Jane Sharp, PA-C, "Jane [name] was last seen on 12/01/18 by Dr. Ellyn [name]. She complained of atypical chest pain, but no further workup was warranted. Since that day, Jane [name] has done well. She underwent left knee surgery on 02/28/19 without cardiac complications. She no longer complains of atypical chest pain, as this was associated with life stressors which she has resolved. Her mobility is limited by her knees, but she goes to rehab three times weekly and denies anginal symptoms. Therefore, based on ACC/AHA guidelines, the patient would be at acceptable risk for the planned procedure without further cardiovascular testing."  Pt complains of a cold sore to her tongue that started a few weeks ago.  Reports she has a chipped tooth that is irritating her tongue.  She denies loose teeth, no signs of infection, denies pain, fever, chills.  Anesthesia to evaluate DOS.  Advised to contact PCP and/or dentist.   Anticipate pt can proceed with planned procedure barring acute status change.   VS: BP (!) 144/90   Pulse 93   Temp 37 C (Oral)   Resp 16   Ht 5' (1.524 m)   Wt 81.6 kg   SpO2 100%   BMI 35.15 kg/m   PROVIDERS: Shirline Frees, MD is PCP   Glenetta Hew, MD is Cardiologist  LABS: Labs reviewed: Acceptable for surgery. (all labs ordered are listed, but only abnormal results are displayed)  Labs Reviewed  CBC - Abnormal; Notable for the following components:      Result Value   WBC 3.9 (*)    All other components within  normal limits  COMPREHENSIVE METABOLIC PANEL - Abnormal; Notable for the following components:   BUN 29 (*)    All other components within normal limits  HEMOGLOBIN A1C - Abnormal; Notable for the following components:   Hgb A1c MFr Bld 6.1 (*)    All other components within normal limits  SURGICAL PCR SCREEN  APTT  PROTIME-INR  TYPE AND SCREEN     IMAGES:   EKG: 12/01/2018 Rate 92 bpm Normal sinus rhythm  Left axis deviation  Low voltage QRS Cannot rule out anterior infarct, age undetermined Poor R wave progression  CV:  Past Medical History:  Diagnosis Date  . Allergic rhinitis   . Anxiety   . Arthritis   . Asthma   . Bilateral shoulder pain   . Cataract    Bilateral  . Depression   . GERD (gastroesophageal reflux disease)   . History of bronchitis   . History of colon polyps   . Hypertension   . Pre-diabetes   . Rectal cancer ([place])    Dx 2004  . Uterine fibroid 2005    Past Surgical History:  Procedure Laterality Date  . APPENDECTOMY  2004  . CATARACT EXTRACTION, BILATERAL    . CESAREAN SECTION     x2  . COLONOSCOPY  2019  . LOW ANTERIOR BOWEL RESECTION  2004   For rectal cancer  . TOTAL KNEE ARTHROPLASTY Left 02/28/2019  Procedure: TOTAL KNEE ARTHROPLASTY;  Surgeon: Gaynelle Arabian, MD;  Location: [facility];  Service: Orthopedics;  Laterality: Left;    MEDICATIONS: . ADVAIR DISKUS 250-50 MCG/DOSE AEPB  . albuterol (VENTOLIN HFA) 108 (90 BASE) MCG/ACT inhaler  . atorvastatin (LIPITOR) 40 MG tablet  . buPROPion (WELLBUTRIN XL) 300 MG 24 hr tablet  . gabapentin (NEURONTIN) 300 MG capsule  . HYDROcodone-homatropine (HYCODAN) 5-1.5 MG/5ML syrup  . meloxicam (MOBIC) 15 MG tablet  . methocarbamol (ROBAXIN) 500 MG tablet  . oxyCODONE (OXY IR/ROXICODONE) 5 MG immediate release tablet  . PARoxetine (PAXIL) 20 MG tablet  . rivaroxaban (XARELTO) 10 MG TABS tablet  . trolamine salicylate (ASPERCREME) 10 % cream   No current facility-administered medications  for this encounter.      Maia Plan [facility] Pre-Surgical Testing [phone] 06/16/19  12:56 PM

## 2019-06-16 NOTE — Anesthesia Preprocedure Evaluation (Addendum)
Anesthesia Evaluation  Patient identified by MRN, date of birth, ID band Patient awake    Reviewed: Allergy & Precautions, NPO status , Patient's Chart, lab work & pertinent test results  Airway Mallampati: II  TM Distance: >3 FB Neck ROM: Full    Dental  (+) Missing, Dental Advisory Given,    Pulmonary asthma ,    breath sounds clear to auscultation       Cardiovascular hypertension,  Rhythm:Regular Rate:Normal     Neuro/Psych Anxiety Depression negative neurological ROS     GI/Hepatic Neg liver ROS, GERD  ,  Endo/Other  negative endocrine ROS  Renal/GU negative Renal ROS     Musculoskeletal  (+) Arthritis ,   Abdominal (+) + obese,   Peds  Hematology negative hematology ROS (+)   Anesthesia Other Findings   Reproductive/Obstetrics                           Lab Results  Component Value Date   WBC 3.9 (L) 06/14/2019   HGB 12.6 06/14/2019   HCT 41.4 06/14/2019   MCV 87.7 06/14/2019   PLT 242 06/14/2019   Lab Results  Component Value Date   CREATININE 0.81 06/14/2019   BUN 29 (H) 06/14/2019   NA 140 06/14/2019   K 4.5 06/14/2019   CL 105 06/14/2019   CO2 27 06/14/2019   Lab Results  Component Value Date   INR 1.0 06/14/2019   INR 0.9 02/24/2019   COVID-19 Labs  No results for input(s): DDIMER, FERRITIN, LDH, CRP in the last 72 hours.  Lab Results  Component Value Date   SARSCOV2NAA NOT DETECTED 06/16/2019   [place] NOT DETECTED 02/24/2019     Anesthesia Physical Anesthesia Plan  ASA: II  Anesthesia Plan: Spinal   Post-op Pain Management:  Regional for Post-op pain   Induction: Intravenous  PONV Risk Score and Plan: 3 and Ondansetron, Dexamethasone and Midazolam  Airway Management Planned: Simple Face Mask and Natural Airway  Additional Equipment: None  Intra-op Plan:   Post-operative Plan:   Informed Consent: I have reviewed the patients History  and Physical, chart, labs and discussed the procedure including the risks, benefits and alternatives for the proposed anesthesia with the patient or authorized representative who has indicated his/her understanding and acceptance.       Plan Discussed with: CRNA  Anesthesia Plan Comments: (See PAT note 06/14/2019, Konrad Felix, PA-C)      Anesthesia Quick Evaluation

## 2019-06-17 LAB — NOVEL CORONAVIRUS, NAA (HOSP ORDER, SEND-OUT TO REF LAB; TAT 18-24 HRS): SARS-CoV-2, NAA: NOT DETECTED

## 2019-06-19 MED ORDER — BUPIVACAINE LIPOSOME 1.3 % IJ SUSP
20.0000 mL | Freq: Once | INTRAMUSCULAR | Status: DC
  Filled 2019-06-19: qty 20

## 2019-06-20 ENCOUNTER — Ambulatory Visit (HOSPITAL_COMMUNITY)
Admission: RE | Admit: 2019-06-20 | Discharge: 2019-06-22 | Disposition: A | Discharge status: Home / Self Care | Payer: Medicare Other | Attending: Orthopedic Surgery | Admitting: Orthopedic Surgery

## 2019-06-20 ENCOUNTER — Encounter (HOSPITAL_COMMUNITY)
Admission: RE | Disposition: A | Discharge status: Home / Self Care | Payer: Self-pay | Source: Home / Self Care | Attending: Orthopedic Surgery

## 2019-06-20 ENCOUNTER — Ambulatory Visit (HOSPITAL_COMMUNITY): Payer: Medicare Other | Admitting: Anesthesiology

## 2019-06-20 ENCOUNTER — Ambulatory Visit (HOSPITAL_COMMUNITY): Payer: Medicare Other | Admitting: Physician Assistant

## 2019-06-20 ENCOUNTER — Other Ambulatory Visit: Payer: Self-pay

## 2019-06-20 ENCOUNTER — Encounter (HOSPITAL_COMMUNITY): Payer: Self-pay | Admitting: Emergency Medicine

## 2019-06-20 DIAGNOSIS — Z7982 Long term (current) use of aspirin: Secondary | ICD-10-CM | POA: Diagnosis not present

## 2019-06-20 DIAGNOSIS — Z7951 Long term (current) use of inhaled steroids: Secondary | ICD-10-CM | POA: Insufficient documentation

## 2019-06-20 DIAGNOSIS — M1710 Unilateral primary osteoarthritis, unspecified knee: Secondary | ICD-10-CM | POA: Diagnosis present

## 2019-06-20 DIAGNOSIS — I10 Essential (primary) hypertension: Secondary | ICD-10-CM | POA: Diagnosis not present

## 2019-06-20 DIAGNOSIS — F329 Major depressive disorder, single episode, unspecified: Secondary | ICD-10-CM | POA: Insufficient documentation

## 2019-06-20 DIAGNOSIS — R7303 Prediabetes: Secondary | ICD-10-CM | POA: Insufficient documentation

## 2019-06-20 DIAGNOSIS — Z7901 Long term (current) use of anticoagulants: Secondary | ICD-10-CM | POA: Diagnosis not present

## 2019-06-20 DIAGNOSIS — M1711 Unilateral primary osteoarthritis, right knee: Secondary | ICD-10-CM | POA: Diagnosis present

## 2019-06-20 DIAGNOSIS — Z96652 Presence of left artificial knee joint: Secondary | ICD-10-CM | POA: Insufficient documentation

## 2019-06-20 DIAGNOSIS — E669 Obesity, unspecified: Secondary | ICD-10-CM | POA: Insufficient documentation

## 2019-06-20 DIAGNOSIS — M179 Osteoarthritis of knee, unspecified: Secondary | ICD-10-CM | POA: Diagnosis present

## 2019-06-20 DIAGNOSIS — F419 Anxiety disorder, unspecified: Secondary | ICD-10-CM | POA: Insufficient documentation

## 2019-06-20 DIAGNOSIS — Z8719 Personal history of other diseases of the digestive system: Secondary | ICD-10-CM | POA: Diagnosis not present

## 2019-06-20 DIAGNOSIS — Z79899 Other long term (current) drug therapy: Secondary | ICD-10-CM | POA: Diagnosis not present

## 2019-06-20 DIAGNOSIS — M21161 Varus deformity, not elsewhere classified, right knee: Secondary | ICD-10-CM | POA: Insufficient documentation

## 2019-06-20 DIAGNOSIS — Z6835 Body mass index (BMI) 35.0-35.9, adult: Secondary | ICD-10-CM | POA: Insufficient documentation

## 2019-06-20 DIAGNOSIS — Z85048 Personal history of other malignant neoplasm of rectum, rectosigmoid junction, and anus: Secondary | ICD-10-CM | POA: Insufficient documentation

## 2019-06-20 DIAGNOSIS — J45909 Unspecified asthma, uncomplicated: Secondary | ICD-10-CM | POA: Diagnosis not present

## 2019-06-20 DIAGNOSIS — M21061 Valgus deformity, not elsewhere classified, right knee: Secondary | ICD-10-CM | POA: Insufficient documentation

## 2019-06-20 DIAGNOSIS — M25761 Osteophyte, right knee: Secondary | ICD-10-CM | POA: Insufficient documentation

## 2019-06-20 DIAGNOSIS — Z885 Allergy status to narcotic agent status: Secondary | ICD-10-CM | POA: Diagnosis not present

## 2019-06-20 HISTORY — PX: TOTAL KNEE ARTHROPLASTY: SHX125

## 2019-06-20 LAB — TYPE AND SCREEN
ABO/RH(D): B POS
Antibody Screen: NEGATIVE

## 2019-06-20 SURGERY — ARTHROPLASTY, KNEE, TOTAL
Anesthesia: Spinal | Laterality: Right

## 2019-06-20 MED ORDER — GABAPENTIN 300 MG PO CAPS
300.0000 mg | ORAL_CAPSULE | Freq: Three times a day (TID) | ORAL | Status: DC
  Administered 2019-06-20 – 2019-06-22 (×6): 300 mg via ORAL
  Filled 2019-06-20 (×6): qty 1

## 2019-06-20 MED ORDER — SODIUM CHLORIDE 0.9 % IR SOLN
Status: DC | PRN
  Administered 2019-06-20: 1000 mL

## 2019-06-20 MED ORDER — POLYETHYLENE GLYCOL 3350 17 G PO PACK: 17.0000 g | PACK | Freq: Every day | ORAL | Status: DC | PRN

## 2019-06-20 MED ORDER — ACETAMINOPHEN 325 MG PO TABS: 325.0000 mg | ORAL_TABLET | Freq: Once | ORAL | Status: DC | PRN

## 2019-06-20 MED ORDER — HYDROMORPHONE HCL 1 MG/ML IJ SOLN
INTRAMUSCULAR | Status: AC
  Filled 2019-06-20: qty 1

## 2019-06-20 MED ORDER — LACTATED RINGERS IV SOLN: INTRAVENOUS | Status: DC

## 2019-06-20 MED ORDER — PHENOL 1.4 % MT LIQD: 1.0000 | OROMUCOSAL | Status: DC | PRN

## 2019-06-20 MED ORDER — HYDROMORPHONE HCL 1 MG/ML IJ SOLN
0.2500 mg | INTRAMUSCULAR | Status: DC | PRN
  Administered 2019-06-20 (×4): 0.5 mg via INTRAVENOUS

## 2019-06-20 MED ORDER — HYDROMORPHONE HCL 2 MG PO TABS
4.0000 mg | ORAL_TABLET | ORAL | Status: DC | PRN
  Administered 2019-06-20 – 2019-06-22 (×6): 4 mg via ORAL
  Filled 2019-06-20 (×5): qty 2

## 2019-06-20 MED ORDER — HYDROMORPHONE HCL 2 MG PO TABS
2.0000 mg | ORAL_TABLET | ORAL | Status: DC | PRN
  Administered 2019-06-20: 2 mg via ORAL
  Filled 2019-06-20 (×3): qty 1

## 2019-06-20 MED ORDER — TRANEXAMIC ACID-NACL 1000-0.7 MG/100ML-% IV SOLN
1000.0000 mg | INTRAVENOUS | Status: AC
  Administered 2019-06-20: 1000 mg via INTRAVENOUS
  Filled 2019-06-20: qty 100

## 2019-06-20 MED ORDER — METHOCARBAMOL 500 MG IVPB - SIMPLE MED
500.0000 mg | Freq: Four times a day (QID) | INTRAVENOUS | Status: DC | PRN
  Administered 2019-06-20: 500 mg via INTRAVENOUS
  Filled 2019-06-20: qty 50

## 2019-06-20 MED ORDER — FENTANYL CITRATE (PF) 100 MCG/2ML IJ SOLN
50.0000 ug | INTRAMUSCULAR | Status: DC
  Administered 2019-06-20: 50 ug via INTRAVENOUS
  Filled 2019-06-20: qty 2

## 2019-06-20 MED ORDER — ONDANSETRON HCL 4 MG/2ML IJ SOLN: 4.0000 mg | Freq: Four times a day (QID) | INTRAMUSCULAR | Status: DC | PRN

## 2019-06-20 MED ORDER — CHLORHEXIDINE GLUCONATE 4 % EX LIQD: 60.0000 mL | Freq: Once | CUTANEOUS | Status: DC

## 2019-06-20 MED ORDER — DEXAMETHASONE SODIUM PHOSPHATE 10 MG/ML IJ SOLN
INTRAMUSCULAR | Status: AC
  Filled 2019-06-20: qty 1

## 2019-06-20 MED ORDER — HYDROCODONE-HOMATROPINE 5-1.5 MG/5ML PO SYRP: 5.0000 mL | ORAL_SOLUTION | Freq: Every day | ORAL | Status: DC | PRN

## 2019-06-20 MED ORDER — METHOCARBAMOL 500 MG PO TABS
500.0000 mg | ORAL_TABLET | Freq: Four times a day (QID) | ORAL | Status: DC | PRN
  Administered 2019-06-20 – 2019-06-22 (×5): 500 mg via ORAL
  Filled 2019-06-20 (×5): qty 1

## 2019-06-20 MED ORDER — MIDAZOLAM HCL 2 MG/2ML IJ SOLN
1.0000 mg | INTRAMUSCULAR | Status: DC
  Administered 2019-06-20: 2 mg via INTRAVENOUS
  Filled 2019-06-20: qty 2

## 2019-06-20 MED ORDER — BUPIVACAINE LIPOSOME 1.3 % IJ SUSP
INTRAMUSCULAR | Status: DC | PRN
  Administered 2019-06-20: 20 mL

## 2019-06-20 MED ORDER — BUPROPION HCL ER (XL) 300 MG PO TB24
300.0000 mg | ORAL_TABLET | Freq: Every day | ORAL | Status: DC
  Administered 2019-06-20 – 2019-06-22 (×3): 300 mg via ORAL
  Filled 2019-06-20 (×3): qty 1

## 2019-06-20 MED ORDER — ONDANSETRON HCL 4 MG/2ML IJ SOLN
INTRAMUSCULAR | Status: DC | PRN
  Administered 2019-06-20: 4 mg via INTRAVENOUS

## 2019-06-20 MED ORDER — CEFAZOLIN SODIUM-DEXTROSE 2-4 GM/100ML-% IV SOLN
2.0000 g | Freq: Four times a day (QID) | INTRAVENOUS | Status: AC
  Administered 2019-06-20 (×2): 2 g via INTRAVENOUS
  Filled 2019-06-20 (×2): qty 100

## 2019-06-20 MED ORDER — SODIUM CHLORIDE (PF) 0.9 % IJ SOLN
INTRAMUSCULAR | Status: DC | PRN
  Administered 2019-06-20: 60 mL via INTRAVENOUS

## 2019-06-20 MED ORDER — PAROXETINE HCL 20 MG PO TABS
30.0000 mg | ORAL_TABLET | Freq: Every day | ORAL | Status: DC
  Administered 2019-06-20 – 2019-06-21 (×2): 30 mg via ORAL
  Filled 2019-06-20 (×3): qty 1

## 2019-06-20 MED ORDER — PROPOFOL 500 MG/50ML IV EMUL
INTRAVENOUS | Status: DC | PRN
  Administered 2019-06-20: 20 mg via INTRAVENOUS

## 2019-06-20 MED ORDER — SODIUM CHLORIDE (PF) 0.9 % IJ SOLN
INTRAMUSCULAR | Status: AC
  Filled 2019-06-20: qty 10

## 2019-06-20 MED ORDER — POVIDONE-IODINE 10 % EX SWAB: 2.0000 "application " | Freq: Once | CUTANEOUS | Status: DC

## 2019-06-20 MED ORDER — CEFAZOLIN SODIUM-DEXTROSE 2-4 GM/100ML-% IV SOLN
2.0000 g | INTRAVENOUS | Status: AC
  Administered 2019-06-20: 2 g via INTRAVENOUS
  Filled 2019-06-20: qty 100

## 2019-06-20 MED ORDER — DOCUSATE SODIUM 100 MG PO CAPS
100.0000 mg | ORAL_CAPSULE | Freq: Two times a day (BID) | ORAL | Status: DC
  Administered 2019-06-20 – 2019-06-22 (×4): 100 mg via ORAL
  Filled 2019-06-20 (×4): qty 1

## 2019-06-20 MED ORDER — SODIUM CHLORIDE 0.9 % IV SOLN
INTRAVENOUS | Status: DC
  Administered 2019-06-20: 13:00:00 via INTRAVENOUS

## 2019-06-20 MED ORDER — METHOCARBAMOL 500 MG IVPB - SIMPLE MED
INTRAVENOUS | Status: AC
  Filled 2019-06-20: qty 50

## 2019-06-20 MED ORDER — DEXAMETHASONE SODIUM PHOSPHATE 10 MG/ML IJ SOLN
10.0000 mg | Freq: Once | INTRAMUSCULAR | Status: AC
  Administered 2019-06-21: 10 mg via INTRAVENOUS
  Filled 2019-06-20: qty 1

## 2019-06-20 MED ORDER — MENTHOL 3 MG MT LOZG: 1.0000 | LOZENGE | OROMUCOSAL | Status: DC | PRN

## 2019-06-20 MED ORDER — ROPIVACAINE HCL 7.5 MG/ML IJ SOLN
INTRAMUSCULAR | Status: DC | PRN
  Administered 2019-06-20: 20 mL via PERINEURAL

## 2019-06-20 MED ORDER — BUPIVACAINE IN DEXTROSE 0.75-8.25 % IT SOLN
INTRATHECAL | Status: DC | PRN
  Administered 2019-06-20: 1.6 mL via INTRATHECAL

## 2019-06-20 MED ORDER — ALBUTEROL SULFATE (2.5 MG/3ML) 0.083% IN NEBU: 2.5000 mg | INHALATION_SOLUTION | RESPIRATORY_TRACT | Status: DC | PRN

## 2019-06-20 MED ORDER — METOCLOPRAMIDE HCL 5 MG PO TABS: 5.0000 mg | ORAL_TABLET | Freq: Three times a day (TID) | ORAL | Status: DC | PRN

## 2019-06-20 MED ORDER — ONDANSETRON HCL 4 MG/2ML IJ SOLN
INTRAMUSCULAR | Status: AC
  Filled 2019-06-20: qty 2

## 2019-06-20 MED ORDER — FLUTICASONE FUROATE-VILANTEROL 200-25 MCG/INH IN AEPB
1.0000 | INHALATION_SPRAY | Freq: Every day | RESPIRATORY_TRACT | Status: DC | PRN
  Administered 2019-06-20: 1 via RESPIRATORY_TRACT
  Filled 2019-06-20: qty 28

## 2019-06-20 MED ORDER — MEPERIDINE HCL 50 MG/ML IJ SOLN: 6.2500 mg | INTRAMUSCULAR | Status: DC | PRN

## 2019-06-20 MED ORDER — BISACODYL 10 MG RE SUPP: 10.0000 mg | Freq: Every day | RECTAL | Status: DC | PRN

## 2019-06-20 MED ORDER — HYDROMORPHONE HCL 1 MG/ML IJ SOLN
0.5000 mg | INTRAMUSCULAR | Status: DC | PRN
  Administered 2019-06-20: 0.5 mg via INTRAVENOUS
  Administered 2019-06-21: 1 mg via INTRAVENOUS
  Filled 2019-06-20 (×2): qty 1

## 2019-06-20 MED ORDER — PHENYLEPHRINE HCL (PRESSORS) 10 MG/ML IV SOLN
INTRAVENOUS | Status: AC
  Filled 2019-06-20: qty 1

## 2019-06-20 MED ORDER — ATORVASTATIN CALCIUM 40 MG PO TABS
40.0000 mg | ORAL_TABLET | Freq: Every day | ORAL | Status: DC
  Administered 2019-06-21 – 2019-06-22 (×2): 40 mg via ORAL
  Filled 2019-06-20 (×2): qty 1

## 2019-06-20 MED ORDER — MIDAZOLAM HCL 5 MG/5ML IJ SOLN
INTRAMUSCULAR | Status: DC | PRN
  Administered 2019-06-20 (×2): 1 mg via INTRAVENOUS

## 2019-06-20 MED ORDER — METOCLOPRAMIDE HCL 5 MG/ML IJ SOLN: 5.0000 mg | Freq: Three times a day (TID) | INTRAMUSCULAR | Status: DC | PRN

## 2019-06-20 MED ORDER — ONDANSETRON HCL 4 MG PO TABS: 4.0000 mg | ORAL_TABLET | Freq: Four times a day (QID) | ORAL | Status: DC | PRN

## 2019-06-20 MED ORDER — RIVAROXABAN 10 MG PO TABS
10.0000 mg | ORAL_TABLET | Freq: Every day | ORAL | Status: DC
  Administered 2019-06-21 – 2019-06-22 (×2): 10 mg via ORAL
  Filled 2019-06-20 (×2): qty 1

## 2019-06-20 MED ORDER — LACTATED RINGERS IV SOLN
INTRAVENOUS | Status: DC
  Administered 2019-06-20: 06:00:00 via INTRAVENOUS

## 2019-06-20 MED ORDER — FLEET ENEMA 7-19 GM/118ML RE ENEM: 1.0000 | ENEMA | Freq: Once | RECTAL | Status: DC | PRN

## 2019-06-20 MED ORDER — DIPHENHYDRAMINE HCL 12.5 MG/5ML PO ELIX: 12.5000 mg | ORAL_SOLUTION | ORAL | Status: DC | PRN

## 2019-06-20 MED ORDER — SODIUM CHLORIDE (PF) 0.9 % IJ SOLN
INTRAMUSCULAR | Status: AC
  Filled 2019-06-20: qty 50

## 2019-06-20 MED ORDER — ACETAMINOPHEN 160 MG/5ML PO SOLN: 325.0000 mg | Freq: Once | ORAL | Status: DC | PRN

## 2019-06-20 MED ORDER — PROMETHAZINE HCL 25 MG/ML IJ SOLN: 6.2500 mg | INTRAMUSCULAR | Status: DC | PRN

## 2019-06-20 MED ORDER — ACETAMINOPHEN 500 MG PO TABS
1000.0000 mg | ORAL_TABLET | Freq: Four times a day (QID) | ORAL | Status: AC
  Administered 2019-06-20 – 2019-06-21 (×4): 1000 mg via ORAL
  Filled 2019-06-20 (×3): qty 2

## 2019-06-20 MED ORDER — ACETAMINOPHEN 10 MG/ML IV SOLN: 1000.0000 mg | Freq: Once | INTRAVENOUS | Status: DC | PRN

## 2019-06-20 MED ORDER — MIDAZOLAM HCL 2 MG/2ML IJ SOLN
INTRAMUSCULAR | Status: AC
  Filled 2019-06-20: qty 2

## 2019-06-20 MED ORDER — DEXAMETHASONE SODIUM PHOSPHATE 10 MG/ML IJ SOLN
8.0000 mg | Freq: Once | INTRAMUSCULAR | Status: AC
  Administered 2019-06-20: 8 mg via INTRAVENOUS

## 2019-06-20 MED ORDER — ACETAMINOPHEN 10 MG/ML IV SOLN
1000.0000 mg | Freq: Four times a day (QID) | INTRAVENOUS | Status: DC
  Administered 2019-06-20: 1000 mg via INTRAVENOUS
  Filled 2019-06-20: qty 100

## 2019-06-20 MED ORDER — PROPOFOL 10 MG/ML IV BOLUS
INTRAVENOUS | Status: AC
  Filled 2019-06-20: qty 40

## 2019-06-20 MED ORDER — SODIUM CHLORIDE 0.9 % IV SOLN
INTRAVENOUS | Status: DC | PRN
  Administered 2019-06-20: 25 ug/min via INTRAVENOUS

## 2019-06-20 MED ORDER — PROPOFOL 500 MG/50ML IV EMUL
INTRAVENOUS | Status: DC | PRN
  Administered 2019-06-20: 50 ug/kg/min via INTRAVENOUS

## 2019-06-20 SURGICAL SUPPLY — 60 items
ATTUNE PSFEM RTSZ5 NARCEM KNEE (Femur) ×3 IMPLANT
ATTUNE PSRP INSR SZ 5 10M KNEE (Insert) ×3 IMPLANT
BAG ZIPLOCK 12X15 (MISCELLANEOUS) ×3 IMPLANT
BASEPLATE TIBIAL ROTATING SZ 4 (Knees) ×3 IMPLANT
BLADE SAG 18X100X1.27 (BLADE) ×3 IMPLANT
BLADE SAW SGTL 11.0X1.19X90.0M (BLADE) ×3 IMPLANT
BLADE SURG SZ10 CARB STEEL (BLADE) ×6 IMPLANT
BNDG ELASTIC 6X5.8 VLCR STR LF (GAUZE/BANDAGES/DRESSINGS) ×3 IMPLANT
BOWL SMART MIX CTS (DISPOSABLE) ×3 IMPLANT
CEMENT HV SMART SET (Cement) ×6 IMPLANT
CLOSURE STERI-STRIP 1/2X4 (GAUZE/BANDAGES/DRESSINGS) ×1
CLOSURE WOUND 1/2 X4 (GAUZE/BANDAGES/DRESSINGS) ×2
CLSR STERI-STRIP ANTIMIC 1/2X4 (GAUZE/BANDAGES/DRESSINGS) ×2 IMPLANT
COVER SURGICAL LIGHT HANDLE (MISCELLANEOUS) ×3 IMPLANT
COVER WAND RF STERILE (DRAPES) IMPLANT
CUFF TOURN SGL QUICK 34 (TOURNIQUET CUFF) ×2
CUFF TRNQT CYL 34X4.125X (TOURNIQUET CUFF) ×1 IMPLANT
DECANTER SPIKE VIAL GLASS SM (MISCELLANEOUS) ×3 IMPLANT
DRAPE U-SHAPE 47X51 STRL (DRAPES) ×3 IMPLANT
DRSG ADAPTIC 3X8 NADH LF (GAUZE/BANDAGES/DRESSINGS) ×3 IMPLANT
DRSG EMULSION OIL 3X16 NADH (GAUZE/BANDAGES/DRESSINGS) ×3 IMPLANT
DRSG PAD ABDOMINAL 8X10 ST (GAUZE/BANDAGES/DRESSINGS) ×3 IMPLANT
DURAPREP 26ML APPLICATOR (WOUND CARE) ×3 IMPLANT
ELECT REM PT RETURN 15FT ADLT (MISCELLANEOUS) ×3 IMPLANT
EVACUATOR 1/8 PVC DRAIN (DRAIN) ×3 IMPLANT
GAUZE SPONGE 4X4 12PLY STRL (GAUZE/BANDAGES/DRESSINGS) ×3 IMPLANT
GLOVE BIO SURGEON STRL SZ7 (GLOVE) ×3 IMPLANT
GLOVE BIO SURGEON STRL SZ8 (GLOVE) ×3 IMPLANT
GLOVE BIOGEL PI IND STRL 6.5 (GLOVE) ×1 IMPLANT
GLOVE BIOGEL PI IND STRL 7.0 (GLOVE) ×1 IMPLANT
GLOVE BIOGEL PI IND STRL 8 (GLOVE) ×1 IMPLANT
GLOVE BIOGEL PI INDICATOR 6.5 (GLOVE) ×2
GLOVE BIOGEL PI INDICATOR 7.0 (GLOVE) ×2
GLOVE BIOGEL PI INDICATOR 8 (GLOVE) ×2
GLOVE SURG SS PI 6.5 STRL IVOR (GLOVE) ×3 IMPLANT
GOWN STRL REUS W/TWL LRG LVL3 (GOWN DISPOSABLE) ×9 IMPLANT
HANDPIECE INTERPULSE COAX TIP (DISPOSABLE) ×2
HOLDER FOLEY CATH W/STRAP (MISCELLANEOUS) IMPLANT
IMMOBILIZER KNEE 20 (SOFTGOODS) ×3
IMMOBILIZER KNEE 20 THIGH 36 (SOFTGOODS) ×1 IMPLANT
KIT TURNOVER KIT A (KITS) IMPLANT
MANIFOLD NEPTUNE II (INSTRUMENTS) ×3 IMPLANT
NS IRRIG 1000ML POUR BTL (IV SOLUTION) ×3 IMPLANT
PACK TOTAL KNEE CUSTOM (KITS) ×3 IMPLANT
PADDING CAST COTTON 6X4 STRL (CAST SUPPLIES) ×3 IMPLANT
PATELLA MEDIAL ATTUN 35MM KNEE (Knees) ×3 IMPLANT
PIN DRILL FIX HALF THREAD (BIT) ×3 IMPLANT
PIN STEINMAN FIXATION KNEE (PIN) ×3 IMPLANT
PROTECTOR NERVE ULNAR (MISCELLANEOUS) ×3 IMPLANT
SET HNDPC FAN SPRY TIP SCT (DISPOSABLE) ×1 IMPLANT
STRIP CLOSURE SKIN 1/2X4 (GAUZE/BANDAGES/DRESSINGS) ×4 IMPLANT
SUT MNCRL AB 4-0 PS2 18 (SUTURE) ×3 IMPLANT
SUT STRATAFIX 0 PDS 27 VIOLET (SUTURE) ×3
SUT VIC AB 2-0 CT1 27 (SUTURE) ×6
SUT VIC AB 2-0 CT1 TAPERPNT 27 (SUTURE) ×3 IMPLANT
SUTURE STRATFX 0 PDS 27 VIOLET (SUTURE) ×1 IMPLANT
TRAY FOLEY MTR SLVR 16FR STAT (SET/KITS/TRAYS/PACK) ×3 IMPLANT
WATER STERILE IRR 1000ML POUR (IV SOLUTION) ×6 IMPLANT
WRAP KNEE MAXI GEL POST OP (GAUZE/BANDAGES/DRESSINGS) ×3 IMPLANT
YANKAUER SUCT BULB TIP 10FT TU (MISCELLANEOUS) ×3 IMPLANT

## 2019-06-20 NOTE — Discharge Instructions (Addendum)
Dr. Gaynelle Arabian Total Joint Specialist [facility] [address], [place], [place] [address] [phone]  TOTAL KNEE REPLACEMENT POSTOPERATIVE DIRECTIONS  Knee Rehabilitation, Guidelines Following Surgery  Results after knee surgery are often greatly improved when you follow the exercise, range of motion and muscle strengthening exercises prescribed by your doctor. Safety measures are also important to protect the knee from further injury. Any time any of these exercises cause you to have increased pain or swelling in your knee joint, decrease the amount until you are comfortable again and slowly increase them. If you have problems or questions, call your caregiver or physical therapist for advice.   Gabapentin 300 mg Protocol Take a 300 mg capsule three times a day for two weeks, Then a 300 mg capsule twice a day for two weeks, Then a 300 mg capsule once a day for two weeks, then discontinue the Gabapentin.  HOME CARE INSTRUCTIONS  Remove items at home which could result in a fall. This includes throw rugs or furniture in walking pathways.   ICE to the affected knee every three hours for 30 minutes at a time and then as needed for pain and swelling.  Continue to use ice on the knee for pain and swelling from surgery. You may notice swelling that will progress down to the foot and ankle.  This is normal after surgery.  Elevate the leg when you are not up walking on it.    Continue to use the breathing machine which will help keep your temperature down.  It is common for your temperature to cycle up and down following surgery, especially at night when you are not up moving around and exerting yourself.  The breathing machine keeps your lungs expanded and your temperature down.  Do not place pillow under knee, focus on keeping the knee straight while resting  DIET You may resume your previous home diet once your are discharged from the hospital.  DRESSING / WOUND CARE /  SHOWERING You may shower 3 days after surgery, but keep the wounds dry during showering.  You may use an occlusive plastic wrap (Press'n Seal for example), NO SOAKING/SUBMERGING IN THE BATHTUB.  If the bandage gets wet, change with a clean dry gauze.  If the incision gets wet, pat the wound dry with a clean towel. You may start showering once you are discharged home but do not submerge the incision under water. Just pat the incision dry and apply a dry gauze dressing on daily. Change the surgical dressing daily and reapply a dry dressing each time.  ACTIVITY Walk with your walker as instructed. Use walker as long as suggested by your caregivers. Avoid periods of inactivity such as sitting longer than an hour when not asleep. This helps prevent blood clots.  You may resume a sexual relationship in one month or when given the OK by your doctor.  You may return to work once you are cleared by your doctor.  Do not drive a car for 6 weeks or until released by you surgeon.  Do not drive while taking narcotics.  WEIGHT BEARING Weight bearing as tolerated with assist device (walker, cane, etc) as directed, use it as long as suggested by your surgeon or therapist, typically at least 4-6 weeks.  POSTOPERATIVE CONSTIPATION PROTOCOL Constipation - defined medically as fewer than three stools per week and severe constipation as less than one stool per week.  One of the most common issues patients have following surgery is constipation.  Even if you have a regular bowel pattern at home, your normal regimen is likely to be disrupted due to multiple reasons following surgery.  Combination of anesthesia, postoperative narcotics, change in appetite and fluid intake all can affect your bowels.  In order to avoid complications following surgery, here are some recommendations in order to help you during your recovery period. ° °Colace (docusate) - Pick up an over-the-counter form of Colace or another stool softener  and take twice a day as long as you are requiring postoperative pain medications.  Take with a full glass of water daily.  If you experience loose stools or diarrhea, hold the colace until you stool forms back up.  If your symptoms do not get better within 1 week or if they get worse, check with your doctor. ° °Dulcolax (bisacodyl) - Pick up over-the-counter and take as directed by the product packaging as needed to assist with the movement of your bowels.  Take with a full glass of water.  Use this product as needed if not relieved by Colace only.  ° °MiraLax (polyethylene glycol) - Pick up over-the-counter to have on hand.  MiraLax is a solution that will increase the amount of water in your bowels to assist with bowel movements.  Take as directed and can mix with a glass of water, juice, soda, coffee, or tea.  Take if you go more than two days without a movement. °Do not use MiraLax more than once per day. Call your doctor if you are still constipated or irregular after using this medication for 7 days in a row. ° °If you continue to have problems with postoperative constipation, please contact the office for further assistance and recommendations.  If you experience "the worst abdominal pain ever" or develop nausea or vomiting, please contact the office immediatly for further recommendations for treatment. ° °ITCHING ° If you experience itching with your medications, try taking only a single pain pill, or even half a pain pill at a time.  You can also use Benadryl over the counter for itching or also to help with sleep.  ° °TED HOSE STOCKINGS °Wear the elastic stockings on both legs for three weeks following surgery during the day but you may remove then at night for sleeping. ° °MEDICATIONS °See your medication summary on the “After Visit Summary” that the nursing staff will review with you prior to discharge.  You may have some home medications which will be placed on hold until you complete the course of  blood thinner medication.  It is important for you to complete the blood thinner medication as prescribed by your surgeon.  Continue your approved medications as instructed at time of discharge. ° °PRECAUTIONS °If you experience chest pain or shortness of breath - call 911 immediately for transfer to the hospital emergency department.  °If you develop a fever greater that 101 F, purulent drainage from wound, increased redness or drainage from wound, foul odor from the wound/dressing, or calf pain - CONTACT YOUR SURGEON.   °                                                °FOLLOW-UP APPOINTMENTS °Make sure you keep all of your appointments after your operation with your surgeon and caregivers. You should call the office at the above phone number and make an appointment for approximately   two weeks after the date of your surgery or on the date instructed by your surgeon outlined in the "After Visit Summary". ° ° °RANGE OF MOTION AND STRENGTHENING EXERCISES  °Rehabilitation of the knee is important following a knee injury or an operation. After just a few days of immobilization, the muscles of the thigh which control the knee become weakened and shrink (atrophy). Knee exercises are designed to build up the tone and strength of the thigh muscles and to improve knee motion. Often times heat used for twenty to thirty minutes before working out will loosen up your tissues and help with improving the range of motion but do not use heat for the first two weeks following surgery. These exercises can be done on a training (exercise) mat, on the floor, on a table or on a bed. Use what ever works the best and is most comfortable for you Knee exercises include:  °Leg Lifts - While your knee is still immobilized in a splint or cast, you can do straight leg raises. Lift the leg to 60 degrees, hold for 3 sec, and slowly lower the leg. Repeat 10-20 times 2-3 times daily. Perform this exercise against resistance later as your knee gets  better.  °Quad and Hamstring Sets - Tighten up the muscle on the front of the thigh (Quad) and hold for 5-10 sec. Repeat this 10-20 times hourly. Hamstring sets are done by pushing the foot backward against an object and holding for 5-10 sec. Repeat as with quad sets.  °· Leg Slides: Lying on your back, slowly slide your foot toward your buttocks, bending your knee up off the floor (only go as far as is comfortable). Then slowly slide your foot back down until your leg is flat on the floor again. °· Angel Wings: Lying on your back spread your legs to the side as far apart as you can without causing discomfort.  °A rehabilitation program following serious knee injuries can speed recovery and prevent re-injury in the future due to weakened muscles. Contact your doctor or a physical therapist for more information on knee rehabilitation.  ° °IF YOU ARE TRANSFERRED TO A SKILLED REHAB FACILITY °If the patient is transferred to a skilled rehab facility following release from the hospital, a list of the current medications will be sent to the facility for the patient to continue.  When discharged from the skilled rehab facility, please have the facility set up the patient's Home Health Physical Therapy prior to being released. Also, the skilled facility will be responsible for providing the patient with their medications at time of release from the facility to include their pain medication, the muscle relaxants, and their blood thinner medication. If the patient is still at the rehab facility at time of the two week follow up appointment, the skilled rehab facility will also need to assist the patient in arranging follow up appointment in our office and any transportation needs. ° °MAKE SURE YOU:  °Understand these instructions.  °Get help right away if you are not doing well or get worse.  ° ° °Pick up stool softner and laxative for home use following surgery while on pain medications. °Do not submerge incision under  water. °Please use good hand washing techniques while changing dressing each day. °May shower starting three days after surgery. °Please use a clean towel to pat the incision dry following showers. °Continue to use ice for pain and swelling after surgery. °Do not use any lotions or creams on the incision   until instructed by your surgeon.   Information on my medicine - XARELTO (Rivaroxaban)  This medication education was reviewed with me or my healthcare representative as part of my discharge preparation.  Why was Xarelto prescribed for you? Xarelto was prescribed for you to reduce the risk of blood clots forming after orthopedic surgery. The medical term for these abnormal blood clots is venous thromboembolism (VTE).  What do you need to know about xarelto ? Take your Xarelto ONCE DAILY at the same time every day. You may take it either with or without food.  If you have difficulty swallowing the tablet whole, you may crush it and mix in applesauce just prior to taking your dose.  Take Xarelto exactly as prescribed by your doctor and DO NOT stop taking Xarelto without talking to the doctor who prescribed the medication.  Stopping without other VTE prevention medication to take the place of Xarelto may increase your risk of developing a clot.  After discharge, you should have regular check-up appointments with your healthcare provider that is prescribing your Xarelto.    What do you do if you miss a dose? If you miss a dose, take it as soon as you remember on the same day then continue your regularly scheduled once daily regimen the next day. Do not take two doses of Xarelto on the same day.   Important Safety Information A possible side effect of Xarelto is bleeding. You should call your healthcare provider right away if you experience any of the following: ? Bleeding from an injury or your nose that does not stop. ? Unusual colored urine (red or dark brown) or unusual colored  stools (red or black). ? Unusual bruising for unknown reasons. ? A serious fall or if you hit your head (even if there is no bleeding).  Some medicines may interact with Xarelto and might increase your risk of bleeding while on Xarelto. To help avoid this, consult your healthcare provider or pharmacist prior to using any new prescription or non-prescription medications, including herbals, vitamins, non-steroidal anti-inflammatory drugs (NSAIDs) and supplements.  This website has more information on Xarelto: [email or web address]

## 2019-06-20 NOTE — Anesthesia Procedure Notes (Signed)
Anesthesia Regional Block: Adductor canal block   Pre-Anesthetic Checklist: ,, timeout performed, Correct Patient, Correct Site, Correct Laterality, Correct Procedure, Correct Position, site marked, Risks and benefits discussed,  Surgical consent,  Pre-op evaluation,  At surgeon's request and post-op pain management  Laterality: Right  Prep: chloraprep       Needles:  Injection technique: Single-shot  Needle Type: Echogenic Stimulator Needle     Needle Length: 9cm  Needle Gauge: 21     Additional Needles:   Procedures:,,,, ultrasound used (permanent image in chart),,,,  Narrative:  Start time: 06/20/2019 7:40 AM End time: 06/20/2019 7:50 AM Injection made incrementally with aspirations every 5 mL.  Performed by: Personally  Anesthesiologist: Effie Berkshire, MD  Additional Notes: Patient tolerated the procedure well. Local anesthetic introduced in an incremental fashion under minimal resistance after negative aspirations. No paresthesias were elicited. After completion of the procedure, no acute issues were identified and patient continued to be monitored by RN.

## 2019-06-20 NOTE — Interval H&P Note (Signed)
History and Physical Interval Note:  06/20/2019 6:21 AM  Laurin Coder  has presented today for surgery, with the diagnosis of right knee osteoarthritis.  The various methods of treatment have been discussed with the patient and family. After consideration of risks, benefits and other options for treatment, the patient has consented to  Procedure(s) with comments: TOTAL KNEE ARTHROPLASTY (Right) - 50min as a surgical intervention.  The patient's history has been reviewed, patient examined, no change in status, stable for surgery.  I have reviewed the patient's chart and labs.  Questions were answered to the patient's satisfaction.     Pilar Plate Versa Craton

## 2019-06-20 NOTE — Op Note (Signed)
OPERATIVE REPORT-TOTAL KNEE ARTHROPLASTY   Pre-operative diagnosis- Osteoarthritis  Right knee(s)  Post-operative diagnosis- Osteoarthritis Right knee(s)  Procedure-  Right  Total Knee Arthroplasty  Surgeon- Dione Plover. Glorian Mcdonell, MD  Assistant- Ardeen Jourdain, PA-C   Anesthesia-  Aductor canal block and spinal  EBL-10 mL   Drains Hemovac  Tourniquet time-  Total Tourniquet Time Documented: Thigh (Right) - 33 minutes Total: Thigh (Right) - 33 minutes     Complications- None  Condition-PACU - hemodynamically stable.   Brief Clinical Note  Jane [name] is a 68 y.o. year old female with end stage OA of her right knee with progressively worsening pain and dysfunction. She has constant pain, with activity and at rest and significant functional deficits with difficulties even with ADLs. She has had extensive non-op management including analgesics, injections of cortisone and viscosupplements, and home exercise program, but remains in significant pain with significant dysfunction.Radiographs show bone on bone arthritis medial and patellofemoral with large valgus deformity. She presents now for right Total Knee Arthroplasty.    Procedure in detail---   The patient is brought into the operating room and positioned supine on the operating table. After successful administration of  Aductor canal block and spinal,   a tourniquet is placed high on the  Right thigh(s) and the lower extremity is prepped and draped in the usual sterile fashion. Time out is performed by the operating team and then the  Right lower extremity is wrapped in Esmarch, knee flexed and the tourniquet inflated to 300 mmHg.       A midline incision is made with a ten blade through the subcutaneous tissue to the level of the extensor mechanism. A fresh blade is used to make a medial parapatellar arthrotomy. Soft tissue over the proximal medial tibia is subperiosteally elevated to the joint line with a knife and into the  semimembranosus bursa with a Cobb elevator. Soft tissue over the proximal lateral tibia is elevated with attention being paid to avoiding the patellar tendon on the tibial tubercle. The patella is everted, knee flexed 90 degrees and the ACL and PCL are removed. Findings are bone on bone medial and patellofemoral with large varus deformity and large global osteophytes        The drill is used to create a starting hole in the distal femur and the canal is thoroughly irrigated with sterile saline to remove the fatty contents. The 5 degree Right  valgus alignment guide is placed into the femoral canal and the distal femoral cutting block is pinned to remove 9 mm off the distal femur. Resection is made with an oscillating saw.      The tibia is subluxed forward and the menisci are removed. The extramedullary alignment guide is placed referencing proximally at the medial aspect of the tibial tubercle and distally along the second metatarsal axis and tibial crest. The block is pinned to remove 60mm off the more deficient medial  side. Resection is made with an oscillating saw. Size 4is the most appropriate size for the tibia and the proximal tibia is prepared with the modular drill and keel punch for that size.      The femoral sizing guide is placed and size 5 is most appropriate. Rotation is marked off the epicondylar axis and confirmed by creating a rectangular flexion gap at 90 degrees. The size 5 cutting block is pinned in this rotation and the anterior, posterior and chamfer cuts are made with the oscillating saw. The intercondylar block is then  placed and that cut is made.      Trial size 4 tibial component, trial size 5 narrow posterior stabilized femur and a 10  mm posterior stabilized rotating platform insert trial is placed. Full extension is achieved with excellent varus/valgus and anterior/posterior balance throughout full range of motion. The patella is everted and thickness measured to be 22  mm. Free  hand resection is taken to 12 mm, a 35 template is placed, lug holes are drilled, trial patella is placed, and it tracks normally. Osteophytes are removed off the posterior femur with the trial in place. All trials are removed and the cut bone surfaces prepared with pulsatile lavage. Cement is mixed and once ready for implantation, the size 4 tibial implant, size  5 narrow posterior stabilized femoral component, and the size 35 patella are cemented in place and the patella is held with the clamp. The trial insert is placed and the knee held in full extension. The Exparel (20 ml mixed with 60 ml saline) is injected into the extensor mechanism, posterior capsule, medial and lateral gutters and subcutaneous tissues.  All extruded cement is removed and once the cement is hard the permanent 10 mm posterior stabilized rotating platform insert is placed into the tibial tray.      The wound is copiously irrigated with saline solution and the extensor mechanism closed over a hemovac drain with #1 V-loc suture. The tourniquet is released for a total tourniquet time of 33  minutes. Flexion against gravity is 140 degrees and the patella tracks normally. Subcutaneous tissue is closed with 2.0 vicryl and subcuticular with running 4.0 Monocryl. The incision is cleaned and dried and steri-strips and a bulky sterile dressing are applied. The limb is placed into a knee immobilizer and the patient is awakened and transported to recovery in stable condition.      Please note that a surgical assistant was a medical necessity for this procedure in order to perform it in a safe and expeditious manner. Surgical assistant was necessary to retract the ligaments and vital neurovascular structures to prevent injury to them and also necessary for proper positioning of the limb to allow for anatomic placement of the prosthesis.   Dione Plover Johnathyn Viscomi, MD    06/20/2019, 9:20 AM

## 2019-06-20 NOTE — Evaluation (Signed)
Physical Therapy Evaluation Patient Details Name: Jane [name] MRN: [id number] DOB: [birth year] Today's Date: 06/20/2019   History of Present Illness  Patient is 68 y.o. female s/p Rt TKA on 06/20/19 with PMH significant for HTN, GERD, Lt TKA on 02/28/19, and bil shoulder pain.    Clinical Impression  EVYNNE CLONINGER is a 68 y.o. female POD 0 s/p Rt TKA. Patient reports independence with mobility at baseline. Patient is now limited by functional impairments (see PT problem list below) and requires min assist for transfers and gait with RW. Patient was able to ambulate ~70 feet with RW and cues for safe management today. She is limited by bil shoulder pain. Patient instructed in exercise to facilitate ROM and circulation to manage edema. Patient will benefit from continued skilled PT interventions to address impairments and progress towards PLOF. Acute PT will follow to progress mobility and stair training in preparation for safe discharge home.     Follow Up Recommendations Follow surgeon's recommendation for DC plan and follow-up therapies    Equipment Recommendations  None recommended by PT    Recommendations for Other Services       Precautions / Restrictions Precautions Precautions: Fall Restrictions Weight Bearing Restrictions: No      Mobility  Bed Mobility Overal bed mobility: Needs Assistance Bed Mobility: Supine to Sit     Supine to sit: Min assist;HOB elevated     General bed mobility comments: cues for sequecning and min assist for Rt LE management, pt using bed rails  Transfers Overall transfer level: Needs assistance Equipment used: Rolling walker (2 wheeled) Transfers: Sit to/from Stand Sit to Stand: Min assist         General transfer comment: cues for safe hand placement and technique, assist to initiate power up to stand and to steady upon rising  Ambulation/Gait Ambulation/Gait assistance: Min assist Gait Distance (Feet): 70 Feet Assistive  device: Rolling walker (2 wheeled) Gait Pattern/deviations: Step-to pattern;Decreased step length - right;Decreased step length - left;Decreased stance time - right;Decreased stride length;Trunk flexed;Antalgic Gait velocity: decreased   General Gait Details: verbal cues for safe hand placement and RW management to maintain close proximety  Stairs            Wheelchair Mobility    Modified Rankin (Stroke Patients Only)       Balance Overall balance assessment: Needs assistance Sitting-balance support: No upper extremity supported;Feet supported Sitting balance-Leahy Scale: Good     Standing balance support: During functional activity;Bilateral upper extremity supported Standing balance-Leahy Scale: Fair Standing balance comment: pt requires support for dynamic standing, able to keep balance for static standing with no UE and 1 UE support intermittently            Pertinent Vitals/Pain Pain Assessment: Faces Faces Pain Scale: Hurts even more Pain Location: Lt shoulder and Rt knee Pain Descriptors / Indicators: Sore;Aching Pain Intervention(s): Monitored during session;Limited activity within patient's tolerance;Ice applied;Patient requesting pain meds-RN notified(pain meds at [facility])    Home Living Family/patient expects to be discharged to:: Private residence Living Arrangements: Alone Available Help at Discharge: Family Type of Home: House Home Access: Stairs to enter Entrance Stairs-Rails: Left Entrance Stairs-Number of Steps: 1 or 4  and hand rail Home Layout: One Garden: Bedside commode;Walker - 2 wheels;Cane - single point Additional Comments: pt's daughter is going to stay with her when she returns home and while she recovers initially    Prior Function Level of Independence: Independent  Hand Dominance        Extremity/Trunk Assessment   Upper Extremity Assessment Upper Extremity Assessment: Overall WFL for tasks  assessed    Lower Extremity Assessment Lower Extremity Assessment: Generalized weakness;RLE deficits/detail RLE Deficits / Details: pt wtih moderate quad activation in supine and able to maintain with no extensor lag in SLR; no buckling in WB       Communication   Communication: No difficulties  Cognition Arousal/Alertness: Awake/alert Behavior During Therapy: WFL for tasks assessed/performed Overall Cognitive Status: Within Functional Limits for tasks assessed           General Comments      Exercises Total Joint Exercises Ankle Circles/Pumps: AROM;Both;Seated;10 reps Quad Sets: AROM;10 reps;Supine;Seated(2 sets (1x5 supine, 1x 5 seated))   Assessment/Plan    PT Assessment Patient needs continued PT services  PT Problem List Decreased strength;Decreased balance;Decreased range of motion;Decreased activity tolerance;Decreased mobility;Decreased knowledge of use of DME;Pain       PT Treatment Interventions DME instruction;Functional mobility training;Balance training;Patient/family education;Modalities;Gait training;Therapeutic activities;Therapeutic exercise;Stair training    PT Goals (Current goals can be found in the Care Plan section)  Acute Rehab PT Goals Patient Stated Goal: to return home and start OP therapy PT Goal Formulation: With patient Time For Goal Achievement: 06/27/19 Potential to Achieve Goals: Good    Frequency 7X/week    AM-PAC PT "6 Clicks" Mobility  Outcome Measure Help needed turning from your back to your side while in a flat bed without using bedrails?: A Little Help needed moving from lying on your back to sitting on the side of a flat bed without using bedrails?: A Little Help needed moving to and from a bed to a chair (including a wheelchair)?: A Little Help needed standing up from a chair using your arms (e.g., wheelchair or bedside chair)?: A Little Help needed to walk in hospital room?: A Little Help needed climbing 3-5 steps with a  railing? : A Lot 6 Click Score: 17    End of Session Equipment Utilized During Treatment: Gait belt Activity Tolerance: Patient tolerated treatment well Patient left: in chair;with call bell/phone within reach;with chair alarm set;with family/visitor present;with nursing/sitter in room Nurse Communication: Mobility status PT Visit Diagnosis: Muscle weakness (generalized) (M62.81);Other abnormalities of gait and mobility (R26.89);Difficulty in walking, not elsewhere classified (R26.2);Pain Pain - Right/Left: Right Pain - part of body: Knee    Time: [id number] PT Time Calculation (min) (ACUTE ONLY): 30 min   Charges:   PT Evaluation $PT Eval Low Complexity: 1 Low PT Treatments $Therapeutic Exercise: 8-22 mins        Kipp Brood, PT, DPT, [facility] Physical Therapist with [facility]  06/20/2019 1:51 PM

## 2019-06-20 NOTE — Transfer of Care (Signed)
Immediate Anesthesia Transfer of Care Note  Patient: Jane [name]  Procedure(s) Performed: TOTAL KNEE ARTHROPLASTY (Right )  Patient Location: PACU  Anesthesia Type:MAC and Spinal  Level of Consciousness: drowsy, patient cooperative and responds to stimulation  Airway & Oxygen Therapy: Patient Spontanous Breathing and Patient connected to nasal cannula oxygen  Post-op Assessment: Report given to RN and Post -op Vital signs reviewed and stable  Post vital signs: Reviewed and stable  Last Vitals:  Vitals Value Taken Time  BP    Temp    Pulse    Resp    SpO2      Last Pain:  Vitals:   06/20/19 0620  TempSrc:   PainSc: 1       Patients Stated Pain Goal: 4 (42/35/36 1443)  Complications: No apparent anesthesia complications

## 2019-06-20 NOTE — Anesthesia Procedure Notes (Signed)
Date/Time: 06/20/2019 8:09 AM Performed by: Glory Buff, CRNA Oxygen Delivery Method: Nasal cannula

## 2019-06-20 NOTE — Anesthesia Postprocedure Evaluation (Signed)
Anesthesia Post Note  Patient: Jane [name]  Procedure(s) Performed: TOTAL KNEE ARTHROPLASTY (Right )     Patient location during evaluation: PACU Anesthesia Type: Spinal Level of consciousness: oriented and awake and alert Pain management: pain level controlled Vital Signs Assessment: post-procedure vital signs reviewed and stable Respiratory status: spontaneous breathing, respiratory function stable and patient connected to nasal cannula oxygen Cardiovascular status: blood pressure returned to baseline and stable Postop Assessment: no headache, no backache, no apparent nausea or vomiting and spinal receding Anesthetic complications: no    Last Vitals:  Vitals:   06/20/19 1055 06/20/19 1157  BP: 128/78 139/78  Pulse: 92 86  Resp: 18 16  Temp: 37 C 36.5 C  SpO2: 99% 100%     LLE Motor Response: Purposeful movement (06/20/19 1200) LLE Sensation: Decreased (06/20/19 1200) RLE Motor Response: Purposeful movement (06/20/19 1200) RLE Sensation: Decreased (06/20/19 1200) L Sensory Level: S1-Sole of foot, small toes (06/20/19 1200) R Sensory Level: S1-Sole of foot, small toes (06/20/19 1200)  Effie Berkshire

## 2019-06-20 NOTE — Progress Notes (Signed)
Assisted Dr. Hollis with right, ultrasound guided, adductor canal block. Side rails up, monitors on throughout procedure. See vital signs in flow sheet. Tolerated Procedure well.  

## 2019-06-20 NOTE — Anesthesia Procedure Notes (Signed)
Spinal  Patient location during procedure: OR Start time: 06/20/2019 8:14 AM End time: 06/20/2019 8:19 AM Staffing Anesthesiologist: Effie Berkshire, MD Resident/CRNA: Glory Buff, CRNA Performed: resident/CRNA  Preanesthetic Checklist Completed: patient identified, site marked, surgical consent, pre-op evaluation, timeout performed, IV checked, risks and benefits discussed and monitors and equipment checked Spinal Block Patient position: sitting Prep: DuraPrep Patient monitoring: heart rate, cardiac monitor, continuous pulse ox and blood pressure Approach: midline Location: L3-4 Injection technique: single-shot Needle Needle type: Pencan  Needle gauge: 24 G Needle length: 9 cm Assessment Sensory level: T4 Additional Notes Kit date checked and verified.  Sterile prep and drape, skin local with 1% lidocaine, - heme, - paraesthesia, + CSF pre and post injection, patient tolerated procedure well.

## 2019-06-21 ENCOUNTER — Encounter (HOSPITAL_COMMUNITY): Payer: Self-pay | Admitting: Orthopedic Surgery

## 2019-06-21 DIAGNOSIS — M1711 Unilateral primary osteoarthritis, right knee: Secondary | ICD-10-CM | POA: Diagnosis not present

## 2019-06-21 LAB — CBC
HCT: 31.5 % — ABNORMAL LOW (ref 36.0–46.0)
Hemoglobin: 9.3 g/dL — ABNORMAL LOW (ref 12.0–15.0)
MCH: 26.8 pg (ref 26.0–34.0)
MCHC: 29.5 g/dL — ABNORMAL LOW (ref 30.0–36.0)
MCV: 90.8 fL (ref 80.0–100.0)
Platelets: 185 10*3/uL (ref 150–400)
RBC: 3.47 MIL/uL — ABNORMAL LOW (ref 3.87–5.11)
RDW: 13.7 % (ref 11.5–15.5)
WBC: 7.9 10*3/uL (ref 4.0–10.5)
nRBC: 0 % (ref 0.0–0.2)

## 2019-06-21 LAB — BASIC METABOLIC PANEL
Anion gap: 9 (ref 5–15)
BUN: 18 mg/dL (ref 8–23)
CO2: 25 mmol/L (ref 22–32)
Calcium: 8.7 mg/dL — ABNORMAL LOW (ref 8.9–10.3)
Chloride: 104 mmol/L (ref 98–111)
Creatinine, Ser: 0.75 mg/dL (ref 0.44–1.00)
GFR calc Af Amer: >60 mL/min (ref >60)
GFR calc non Af Amer: >60 mL/min (ref >60)
Glucose, Bld: 148 mg/dL — ABNORMAL HIGH (ref 70–99)
Potassium: 4.4 mmol/L (ref 3.5–5.1)
Sodium: 138 mmol/L (ref 135–145)

## 2019-06-21 NOTE — Progress Notes (Signed)
Therapy Plan: OPPT at [facility] Patient has DME-RW and  3 in 1

## 2019-06-21 NOTE — Progress Notes (Addendum)
   Subjective: 1 Day Post-Op Procedure(s) (LRB): TOTAL KNEE ARTHROPLASTY (Right) Patient reports pain as mild.   Patient seen in rounds with Dr. Wynelle Link. Patient is well, and has had no acute complaints or problems other than discomfort in the right knee. No acute events overnight. Foley catheter removed, positive flatus. Patient denies CP, SHOB, N/V.  We will start therapy today.   Objective: Vital signs in last 24 hours: Temp:  [97.4 F (36.3 C)-98.3 F (36.8 C)] 98.2 F (36.8 C) (09/29 0914) Pulse Rate:  [90-108] 97 (09/29 0914) Resp:  [16-19] 16 (09/29 0914) BP: (118-156)/(62-89) 118/62 (09/29 0914) SpO2:  [97 %-100 %] 97 % (09/29 0914)  Intake/Output from previous day:  Intake/Output Summary (Last 24 hours) at 06/21/2019 1210 Last data filed at 06/21/2019 0933 Gross per 24 hour  Intake 2685.12 ml  Output 2400 ml  Net 285.12 ml     Intake/Output this shift: Total I/O In: 240 [P.O.:240] Out: -   Labs: Recent Labs    06/21/19 0257  HGB 9.3*   Recent Labs    06/21/19 0257  WBC 7.9  RBC 3.47*  HCT 31.5*  PLT 185   Recent Labs    06/21/19 0257  NA 138  K 4.4  CL 104  CO2 25  BUN 18  CREATININE 0.75  GLUCOSE 148*  CALCIUM 8.7*   No results for input(s): LABPT, INR in the last 72 hours.  Exam: General - Patient is Alert and Oriented Extremity - Neurologically intact Sensation intact distally Intact pulses distally Dorsiflexion/Plantar flexion intact Dressing - dressing C/D/I Motor Function - intact, moving foot and toes well on exam.   Past Medical History:  Diagnosis Date  . Allergic rhinitis   . Anxiety   . Arthritis   . Asthma   . Bilateral shoulder pain   . Cataract    Bilateral  . Depression   . GERD (gastroesophageal reflux disease)   . History of bronchitis   . History of colon polyps   . Hypertension   . Pre-diabetes   . Rectal cancer ([place])    Dx 2004  . Uterine fibroid 2005    Assessment/Plan: 1 Day Post-Op Procedure(s)  (LRB): TOTAL KNEE ARTHROPLASTY (Right) Principal Problem:   Osteoarthritis of right knee Active Problems:   OA (osteoarthritis) of knee  Estimated body mass index is 35.15 kg/m as calculated from the following:   Height as of this encounter: 5' (1.524 m).   Weight as of this encounter: 81.6 kg. Advance diet Up with therapy  Anticipated LOS equal to or greater than 2 midnights due to - Age 22 and older with one or more of the following:  - Obesity  - Expected need for hospital services (PT, OT, Nursing) required for safe  discharge  - Anticipated need for postoperative skilled nursing care or inpatient rehab  - Active co-morbidities: None OR   - Unanticipated findings during/Post Surgery: None  - Patient is a high risk of re-admission due to: None    DVT Prophylaxis - Xarelto Weight bearing as tolerated. D/C O2 and pulse ox and try on room air. Hemovac pulled without difficulty, will begin therapy today.  Plan is to go Home after hospital stay. Plan for likely discharge tomorrow, due to not having someone at home with her today. She will continue to work with therapy today to progress towards safe discharge home tomorrow.   Griffith Citron, PA-C Orthopedic Surgery [phone] 06/21/2019, 12:10 PM

## 2019-06-21 NOTE — Progress Notes (Signed)
Physical Therapy Treatment Patient Details Name: Jane [name] MRN: [id number] DOB: 1951/05/27 Today's Date: 06/21/2019    History of Present Illness Patient is 68 y.o. female s/p Rt TKA on 06/20/19 with PMH significant for HTN, GERD, Lt TKA on 02/28/19, and bil shoulder pain.    PT Comments    Progressing with mobility.    Follow Up Recommendations  Follow surgeon's recommendation for DC plan and follow-up therapies     Equipment Recommendations  None recommended by PT    Recommendations for Other Services       Precautions / Restrictions Precautions Precautions: Fall Restrictions Weight Bearing Restrictions: No Other Position/Activity Restrictions: WBAT    Mobility  Bed Mobility               General bed mobility comments: oob and standing with nursing  Transfers                 General transfer comment: oob and standing with nursing  Ambulation/Gait Ambulation/Gait assistance: Min assist Gait Distance (Feet): 60 Feet Assistive device: Rolling walker (2 wheeled) Gait Pattern/deviations: Step-to pattern;Decreased stance time - right;Antalgic     General Gait Details: Assist to stabilize intermittently.   Stairs             Wheelchair Mobility    Modified Rankin (Stroke Patients Only)       Balance Overall balance assessment: Needs assistance         Standing balance support: Bilateral upper extremity supported Standing balance-Leahy Scale: Poor                              Cognition Arousal/Alertness: Awake/alert Behavior During Therapy: WFL for tasks assessed/performed Overall Cognitive Status: Within Functional Limits for tasks assessed                                        Exercises Total Joint Exercises Ankle Circles/Pumps: AROM;Both;10 reps;Supine Quad Sets: AROM;10 reps;Supine;Both Heel Slides: AAROM;Right;10 reps;Supine Hip ABduction/ADduction: AAROM;Right;10 reps;Supine Straight  Leg Raises: AAROM;Right;10 reps;Supine Goniometric ROM: ~10-60 degrees    General Comments        Pertinent Vitals/Pain Pain Assessment: 0-10 Pain Score: 5  Pain Location: R knee Pain Descriptors / Indicators: Sore;Aching Pain Intervention(s): Monitored during session;Repositioned;Ice applied    Home Living                      Prior Function            PT Goals (current goals can now be found in the care plan section) Progress towards PT goals: Progressing toward goals    Frequency    7X/week      PT Plan Current plan remains appropriate    Co-evaluation              AM-PAC PT "6 Clicks" Mobility   Outcome Measure  Help needed turning from your back to your side while in a flat bed without using bedrails?: A Little Help needed moving from lying on your back to sitting on the side of a flat bed without using bedrails?: A Little Help needed moving to and from a bed to a chair (including a wheelchair)?: A Little Help needed standing up from a chair using your arms (e.g., wheelchair or bedside chair)?: A Little Help needed to walk in hospital room?:  A Little Help needed climbing 3-5 steps with a railing? : A Lot 6 Click Score: 17    End of Session Equipment Utilized During Treatment: Gait belt Activity Tolerance: Patient tolerated treatment well Patient left: in chair;with call bell/phone within reach   PT Visit Diagnosis: Muscle weakness (generalized) (M62.81);Other abnormalities of gait and mobility (R26.89);Difficulty in walking, not elsewhere classified (R26.2);Pain Pain - Right/Left: Right Pain - part of body: Knee     Time: 1020-1036 PT Time Calculation (min) (ACUTE ONLY): 16 min  Charges:  $Gait Training: 8-22 mins                        Jane [name], PT Acute Rehabilitation Services Pager: [phone] Office: [phone]

## 2019-06-21 NOTE — Progress Notes (Signed)
Physical Therapy Treatment Patient Details Name: Jane [name] MRN: [id number] DOB: [birth year] Today's Date: 06/21/2019    History of Present Illness Patient is 68 y.o. female s/p Rt TKA on 06/20/19 with PMH significant for HTN, GERD, Lt TKA on 02/28/19, and bil shoulder pain.    PT Comments    Progressing with mobility. Plan is for home on tomorrow.    Follow Up Recommendations  Follow surgeon's recommendation for DC plan and follow-up therapies     Equipment Recommendations  None recommended by PT    Recommendations for Other Services       Precautions / Restrictions Precautions Precautions: Fall Restrictions Weight Bearing Restrictions: No Other Position/Activity Restrictions: WBAT    Mobility  Bed Mobility Overal bed mobility: Needs Assistance Bed Mobility: Supine to Sit;Sit to Supine     Supine to sit: HOB elevated;Supervision Sit to supine: HOB elevated;Supervision   General bed mobility comments: for safety.  Transfers Overall transfer level: Needs assistance Equipment used: Rolling walker (2 wheeled) Transfers: Sit to/from Stand Sit to Stand: Min guard         General transfer comment: for safety.  Ambulation/Gait Ambulation/Gait assistance: Min guard Gait Distance (Feet): 75 Feet Assistive device: Rolling walker (2 wheeled) Gait Pattern/deviations: Step-to pattern;Antalgic     General Gait Details: for safety. slow gait speed.   Stairs             Wheelchair Mobility    Modified Rankin (Stroke Patients Only)       Balance Overall balance assessment: Needs assistance         Standing balance support: Bilateral upper extremity supported Standing balance-Leahy Scale: Poor                              Cognition Arousal/Alertness: Awake/alert Behavior During Therapy: WFL for tasks assessed/performed Overall Cognitive Status: Within Functional Limits for tasks assessed                                         Exercises   General Comments        Pertinent Vitals/Pain Pain Assessment: 0-10 Pain Score: 5  Pain Location: R knee Pain Descriptors / Indicators: Sore;Aching Pain Intervention(s): Monitored during session;Ice applied;Repositioned    Home Living                      Prior Function            PT Goals (current goals can now be found in the care plan section) Progress towards PT goals: Progressing toward goals    Frequency    7X/week      PT Plan Current plan remains appropriate    Co-evaluation              AM-PAC PT "6 Clicks" Mobility   Outcome Measure  Help needed turning from your back to your side while in a flat bed without using bedrails?: A Little Help needed moving from lying on your back to sitting on the side of a flat bed without using bedrails?: A Little Help needed moving to and from a bed to a chair (including a wheelchair)?: A Little Help needed standing up from a chair using your arms (e.g., wheelchair or bedside chair)?: A Little Help needed to walk in hospital room?: A Little Help needed climbing 3-5  steps with a railing? : A Lot 6 Click Score: 17    End of Session Equipment Utilized During Treatment: Gait belt Activity Tolerance: Patient tolerated treatment well Patient left: in bed;with call bell/phone within reach   PT Visit Diagnosis: Muscle weakness (generalized) (M62.81);Other abnormalities of gait and mobility (R26.89);Difficulty in walking, not elsewhere classified (R26.2);Pain Pain - Right/Left: Right Pain - part of body: Knee     Time: [id number] PT Time Calculation (min) (ACUTE ONLY): 20 min  Charges:  $Gait Training: 8-22 mins                       [name], PT Acute Rehabilitation Services Pager: [phone] Office: [phone]

## 2019-06-22 DIAGNOSIS — M1711 Unilateral primary osteoarthritis, right knee: Secondary | ICD-10-CM | POA: Diagnosis not present

## 2019-06-22 LAB — BASIC METABOLIC PANEL
Anion gap: 7 (ref 5–15)
BUN: 19 mg/dL (ref 8–23)
CO2: 29 mmol/L (ref 22–32)
Calcium: 9.2 mg/dL (ref 8.9–10.3)
Chloride: 99 mmol/L (ref 98–111)
Creatinine, Ser: 0.69 mg/dL (ref 0.44–1.00)
GFR calc Af Amer: >60 mL/min (ref >60)
GFR calc non Af Amer: >60 mL/min (ref >60)
Glucose, Bld: 141 mg/dL — ABNORMAL HIGH (ref 70–99)
Potassium: 4.7 mmol/L (ref 3.5–5.1)
Sodium: 135 mmol/L (ref 135–145)

## 2019-06-22 LAB — CBC
HCT: 30.4 % — ABNORMAL LOW (ref 36.0–46.0)
Hemoglobin: 9.1 g/dL — ABNORMAL LOW (ref 12.0–15.0)
MCH: 26.8 pg (ref 26.0–34.0)
MCHC: 29.9 g/dL — ABNORMAL LOW (ref 30.0–36.0)
MCV: 89.4 fL (ref 80.0–100.0)
Platelets: 185 10*3/uL (ref 150–400)
RBC: 3.4 MIL/uL — ABNORMAL LOW (ref 3.87–5.11)
RDW: 13.9 % (ref 11.5–15.5)
WBC: 8.4 10*3/uL (ref 4.0–10.5)
nRBC: 0 % (ref 0.0–0.2)

## 2019-06-22 MED ORDER — HYDROMORPHONE HCL 2 MG PO TABS: 2.0000 mg | ORAL_TABLET | Freq: Four times a day (QID) | ORAL | 0 refills | Status: DC | PRN

## 2019-06-22 MED ORDER — RIVAROXABAN 10 MG PO TABS: 10.0000 mg | ORAL_TABLET | Freq: Every day | ORAL | 0 refills | Status: DC

## 2019-06-22 MED ORDER — GABAPENTIN 300 MG PO CAPS: 300.0000 mg | ORAL_CAPSULE | Freq: Three times a day (TID) | ORAL | 0 refills | Status: DC

## 2019-06-22 MED ORDER — METHOCARBAMOL 500 MG PO TABS: 500.0000 mg | ORAL_TABLET | Freq: Four times a day (QID) | ORAL | 0 refills | Status: DC | PRN

## 2019-06-22 MED ORDER — METHOCARBAMOL 500 MG PO TABS: 500.0000 mg | ORAL_TABLET | Freq: Four times a day (QID) | ORAL | 0 refills | Status: AC | PRN

## 2019-06-22 MED FILL — HYDROmorphone HCL 2 MG TABS: 2 | 7 days supply | Qty: 56 | Fill #0

## 2019-06-22 MED FILL — GABAPENTIN 300 MG CAPSULE: 300 | 28 days supply | Qty: 84 | Fill #0

## 2019-06-22 MED FILL — METHOCARBAMOL 500 MG TABS: 500 | 10 days supply | Qty: 40 | Fill #0

## 2019-06-22 MED FILL — XARELTO 10 MG TABLET: 10 | 19 days supply | Qty: 19 | Fill #0

## 2019-06-22 NOTE — Progress Notes (Signed)
Subjective: 2 Days Post-Op Procedure(s) (LRB): TOTAL KNEE ARTHROPLASTY (Right) Patient reports pain as mild.   Patient seen in rounds by Dr. Wynelle Link. Patient is well, and has had no acute complaints or problems other than discomfort in the right knee. No acute events overnight. Voiding without difficulty, positive flatus. Patient states she is ready to go home today. We will continue therapy today.   Objective: Vital signs in last 24 hours: Temp:  [98.1 F (36.7 C)-98.6 F (37 C)] 98.6 F (37 C) (09/30 0420) Pulse Rate:  [93-102] 93 (09/30 0420) Resp:  [14-16] 16 (09/30 0420) BP: (118-155)/(62-88) 155/88 (09/30 0420) SpO2:  [97 %-99 %] 99 % (09/30 0420)  Intake/Output from previous day:  Intake/Output Summary (Last 24 hours) at 06/22/2019 0702 Last data filed at 06/22/2019 0200 Gross per 24 hour  Intake 1174.76 ml  Output 1400 ml  Net -225.24 ml     Intake/Output this shift: No intake/output data recorded.  Labs: Recent Labs    06/21/19 0257 06/22/19 0317  HGB 9.3* 9.1*   Recent Labs    06/21/19 0257 06/22/19 0317  WBC 7.9 8.4  RBC 3.47* 3.40*  HCT 31.5* 30.4*  PLT 185 185   Recent Labs    06/21/19 0257 06/22/19 0317  NA 138 135  K 4.4 4.7  CL 104 99  CO2 25 29  BUN 18 19  CREATININE 0.75 0.69  GLUCOSE 148* 141*  CALCIUM 8.7* 9.2   No results for input(s): LABPT, INR in the last 72 hours.  Exam: General - Patient is Alert and Oriented Extremity - Neurologically intact Sensation intact distally Intact pulses distally Dorsiflexion/Plantar flexion intact Dressing - dressing C/D/I Motor Function - intact, moving foot and toes well on exam.   Past Medical History:  Diagnosis Date  . Allergic rhinitis   . Anxiety   . Arthritis   . Asthma   . Bilateral shoulder pain   . Cataract    Bilateral  . Depression   . GERD (gastroesophageal reflux disease)   . History of bronchitis   . History of colon polyps   . Hypertension   . Pre-diabetes    . Rectal cancer ([place])    Dx 2004  . Uterine fibroid 2005    Assessment/Plan: 2 Days Post-Op Procedure(s) (LRB): TOTAL KNEE ARTHROPLASTY (Right) Principal Problem:   Osteoarthritis of right knee Active Problems:   OA (osteoarthritis) of knee  Estimated body mass index is 35.15 kg/m as calculated from the following:   Height as of this encounter: 5' (1.524 m).   Weight as of this encounter: 81.6 kg. Advance diet Up with therapy D/C IV fluids   Patient's anticipated LOS is less than 2 midnights, meeting these requirements: - Younger than 37 - Lives within 1 hour of care - Has a competent adult at home to recover with post-op recover - NO history of  - Chronic pain requiring opiods  - Diabetes  - Coronary Artery Disease  - Heart failure  - Heart attack  - Stroke  - DVT/VTE  - Cardiac arrhythmia  - Respiratory Failure/COPD  - Renal failure  - Anemia  - Advanced Liver disease  DVT Prophylaxis - Xarelto Weight bearing as tolerated.  Plan is to go Home after hospital stay. Plan for discharge home today after 1 session of therapy as long as she is meeting her goals. Scheduled for OPPT. Follow up in the office in 2 weeks.  Griffith Citron, PA-C Orthopedic Surgery [phone] 06/22/2019, 7:02 AM

## 2019-06-22 NOTE — Progress Notes (Addendum)
Physical Therapy Treatment Patient Details Name: Jane [name] MRN: [id number] DOB: 1950/11/27 Today's Date: 06/22/2019    History of Present Illness Patient is 68 y.o. female s/p Rt TKA on 06/20/19 with PMH significant for HTN, GERD, Lt TKA on 02/28/19, and bil shoulder pain.    PT Comments    Progressing with mobility. Pt reports increased soreness on today compared to yesterday. Reviewed/practiced exercises, gait training, and stair training. All education completed. Okay to d/c from PT standpoint.    Follow Up Recommendations  Follow surgeon's recommendation for DC plan and follow-up therapies; Supervision/assist for OOB/mobility     Equipment Recommendations  None recommended by PT    Recommendations for Other Services       Precautions / Restrictions Precautions Precautions: Fall Restrictions Weight Bearing Restrictions: No Other Position/Activity Restrictions: WBAT    Mobility  Bed Mobility Overal bed mobility: Needs Assistance Bed Mobility: Supine to Sit;Sit to Supine     Supine to sit: Min assist;HOB elevated Sit to supine: Min assist;HOB elevated   General bed mobility comments: Assist for R LE. Increased time. Pt having more pain than on yesterday.  Transfers Overall transfer level: Needs assistance Equipment used: Rolling walker (2 wheeled) Transfers: Sit to/from Stand Sit to Stand: Min guard         General transfer comment: for safety. Increased time.  Ambulation/Gait Ambulation/Gait assistance: Min guard Gait Distance (Feet): 60 Feet Assistive device: Rolling walker (2 wheeled) Gait Pattern/deviations: Step-to pattern;Antalgic     General Gait Details: for safety. slow gait speed.   Stairs Stairs: Yes Stairs assistance: Min guard Stair Management: One rail Left;Step to pattern;Forwards;With cane Number of Stairs: 2 General stair comments: Up and over portable steps x 1. VCs safety, technique, sequence. Close guard for safety. Increased  time.   Wheelchair Mobility    Modified Rankin (Stroke Patients Only)       Balance Overall balance assessment: Needs assistance         Standing balance support: Bilateral upper extremity supported Standing balance-Leahy Scale: Poor                              Cognition Arousal/Alertness: Awake/alert Behavior During Therapy: WFL for tasks assessed/performed Overall Cognitive Status: Within Functional Limits for tasks assessed                                        Exercises Total Joint Exercises Ankle Circles/Pumps: AROM;Both;10 reps;Supine Quad Sets: AROM;10 reps;Supine;Both Heel Slides: AAROM;Right;10 reps;Supine Hip ABduction/ADduction: AAROM;Right;10 reps;Supine Straight Leg Raises: AAROM;Right;10 reps;Supine Goniometric ROM: ~10-50 degrees (limited by pain)    General Comments        Pertinent Vitals/Pain Pain Assessment: 0-10 Pain Score: 7  Pain Location: R knee Pain Descriptors / Indicators: Sore;Aching Pain Intervention(s): Monitored during session;Ice applied;Repositioned    Home Living                      Prior Function            PT Goals (current goals can now be found in the care plan section) Progress towards PT goals: Progressing toward goals    Frequency    7X/week      PT Plan Current plan remains appropriate    Co-evaluation  AM-PAC PT "6 Clicks" Mobility   Outcome Measure  Help needed turning from your back to your side while in a flat bed without using bedrails?: A Little Help needed moving from lying on your back to sitting on the side of a flat bed without using bedrails?: A Little Help needed moving to and from a bed to a chair (including a wheelchair)?: A Little Help needed standing up from a chair using your arms (e.g., wheelchair or bedside chair)?: A Little Help needed to walk in hospital room?: A Little Help needed climbing 3-5 steps with a railing? : A  Little 6 Click Score: 18    End of Session Equipment Utilized During Treatment: Gait belt Activity Tolerance: Patient tolerated treatment well Patient left: in bed;with call bell/phone within reach   PT Visit Diagnosis: Muscle weakness (generalized) (M62.81);Other abnormalities of gait and mobility (R26.89);Difficulty in walking, not elsewhere classified (R26.2);Pain Pain - Right/Left: Right Pain - part of body: Knee     Time: [id number] PT Time Calculation (min) (ACUTE ONLY): 33 min  Charges:  $Gait Training: 8-22 mins $Therapeutic Exercise: 8-22 mins                        [name], PT Acute Rehabilitation Services Pager: [phone] Office: [phone] '

## 2019-06-29 ENCOUNTER — Other Ambulatory Visit (HOSPITAL_COMMUNITY): Payer: Self-pay | Admitting: Orthopedic Surgery

## 2019-06-29 ENCOUNTER — Ambulatory Visit (HOSPITAL_COMMUNITY)
Admission: RE | Admit: 2019-06-29 | Discharge: 2019-06-29 | Disposition: A | Discharge status: Home / Self Care | Payer: Medicare Other | Source: Ambulatory Visit | Attending: Orthopedic Surgery | Admitting: Orthopedic Surgery

## 2019-06-29 ENCOUNTER — Other Ambulatory Visit: Payer: Self-pay

## 2019-06-29 DIAGNOSIS — M79604 Pain in right leg: Secondary | ICD-10-CM | POA: Diagnosis present

## 2019-06-29 MED FILL — HYDROmorphone HCL 2 MG TABS: 2 | 7 days supply | Qty: 42 | Fill #0

## 2019-06-29 MED FILL — METHOCARBAMOL 500 MG TABS: 500 | 13 days supply | Qty: 40 | Fill #0

## 2019-07-07 MED FILL — HYDROmorphone HCL 2 MG TABS: 2 | 7 days supply | Qty: 30 | Fill #0

## 2019-07-28 MED FILL — HYDROmorphone HCL 2 MG TABS: 2 | 7 days supply | Qty: 30 | Fill #0

## 2019-12-27 MED FILL — AMOXICILLIN 500 MG CAPSULE: 500 | 5 days supply | Qty: 20 | Fill #0

## 2020-01-02 MED FILL — diazePAM 5 MG TABS: 5 | 2 days supply | Qty: 2 | Fill #0

## 2020-01-02 MED FILL — AMOXICILLIN 500 MG CAPSULE: 500 | 7 days supply | Qty: 21 | Fill #0

## 2020-01-16 MED FILL — HYDROCODON-APAP 5-325: 5-325 | 1 days supply | Qty: 5 | Fill #0

## 2020-01-16 MED FILL — CHLORHEXIDINE 0.12% RINSE: 0.12 | 16 days supply | Qty: 473 | Fill #0

## 2020-02-15 ENCOUNTER — Other Ambulatory Visit: Payer: Self-pay | Admitting: Family Medicine

## 2020-02-15 DIAGNOSIS — M85851 Other specified disorders of bone density and structure, right thigh: Secondary | ICD-10-CM

## 2020-05-22 ENCOUNTER — Other Ambulatory Visit: Payer: Medicare Other

## 2020-05-22 ENCOUNTER — Other Ambulatory Visit: Payer: Self-pay

## 2020-05-22 ENCOUNTER — Ambulatory Visit
Admission: RE | Admit: 2020-05-22 | Discharge: 2020-05-22 | Disposition: A | Discharge status: Home / Self Care | Payer: Medicare PPO | Source: Ambulatory Visit | Attending: Family Medicine | Admitting: Family Medicine

## 2020-05-22 DIAGNOSIS — Z780 Asymptomatic menopausal state: Secondary | ICD-10-CM | POA: Diagnosis not present

## 2020-05-22 DIAGNOSIS — M85851 Other specified disorders of bone density and structure, right thigh: Secondary | ICD-10-CM | POA: Diagnosis not present

## 2020-06-12 ENCOUNTER — Other Ambulatory Visit: Payer: Medicare PPO

## 2020-06-12 DIAGNOSIS — Z20822 Contact with and (suspected) exposure to covid-19: Secondary | ICD-10-CM | POA: Diagnosis not present

## 2020-06-14 LAB — NOVEL CORONAVIRUS, NAA: SARS-CoV-2, NAA: NOT DETECTED

## 2020-06-14 LAB — SARS-COV-2, NAA 2 DAY TAT

## 2020-07-12 ENCOUNTER — Other Ambulatory Visit (HOSPITAL_COMMUNITY): Payer: Self-pay | Admitting: Family Medicine

## 2020-07-13 MED FILL — HYDROCODONE-HOMATROPINE SYR: 5-1.5 | 5 days supply | Qty: 100 | Fill #0

## 2020-08-22 ENCOUNTER — Other Ambulatory Visit (HOSPITAL_COMMUNITY): Payer: Self-pay | Admitting: Family Medicine

## 2020-08-22 DIAGNOSIS — N3281 Overactive bladder: Secondary | ICD-10-CM | POA: Diagnosis not present

## 2020-08-22 DIAGNOSIS — E7800 Pure hypercholesterolemia, unspecified: Secondary | ICD-10-CM | POA: Diagnosis not present

## 2020-08-22 DIAGNOSIS — F324 Major depressive disorder, single episode, in partial remission: Secondary | ICD-10-CM | POA: Diagnosis not present

## 2020-08-22 DIAGNOSIS — J4520 Mild intermittent asthma, uncomplicated: Secondary | ICD-10-CM | POA: Diagnosis not present

## 2020-08-22 DIAGNOSIS — E119 Type 2 diabetes mellitus without complications: Secondary | ICD-10-CM | POA: Diagnosis not present

## 2020-08-22 DIAGNOSIS — I10 Essential (primary) hypertension: Secondary | ICD-10-CM | POA: Diagnosis not present

## 2020-08-22 MED FILL — HYDROCODONE-HOMATROPINE SYR: 5-1.5 | 5 days supply | Qty: 100 | Fill #0

## 2020-08-22 MED FILL — MYRBETRIQ ER 50 MG TABLET: 50 | 30 days supply | Qty: 30 | Fill #0

## 2020-10-27 MED FILL — MYRBETRIQ ER 50 MG TABLET: 50 | 30 days supply | Qty: 30 | Fill #1

## 2020-12-07 DIAGNOSIS — Z01419 Encounter for gynecological examination (general) (routine) without abnormal findings: Secondary | ICD-10-CM | POA: Diagnosis not present

## 2020-12-07 DIAGNOSIS — Z1231 Encounter for screening mammogram for malignant neoplasm of breast: Secondary | ICD-10-CM | POA: Diagnosis not present

## 2020-12-07 DIAGNOSIS — Z6834 Body mass index (BMI) 34.0-34.9, adult: Secondary | ICD-10-CM | POA: Diagnosis not present

## 2020-12-11 ENCOUNTER — Other Ambulatory Visit (HOSPITAL_COMMUNITY): Payer: Self-pay | Admitting: Family Medicine

## 2020-12-11 DIAGNOSIS — I10 Essential (primary) hypertension: Secondary | ICD-10-CM | POA: Diagnosis not present

## 2020-12-11 DIAGNOSIS — J4520 Mild intermittent asthma, uncomplicated: Secondary | ICD-10-CM | POA: Diagnosis not present

## 2020-12-11 MED FILL — HYDROMET SYRUP: 5-1.5 | 5 days supply | Qty: 100 | Fill #0

## 2021-01-07 DIAGNOSIS — R109 Unspecified abdominal pain: Secondary | ICD-10-CM | POA: Diagnosis not present

## 2021-01-07 DIAGNOSIS — R110 Nausea: Secondary | ICD-10-CM | POA: Diagnosis not present

## 2021-01-11 DIAGNOSIS — T148XXA Other injury of unspecified body region, initial encounter: Secondary | ICD-10-CM | POA: Diagnosis not present

## 2021-02-01 ENCOUNTER — Other Ambulatory Visit (HOSPITAL_COMMUNITY): Payer: Self-pay

## 2021-02-01 MED ORDER — PROMETHAZINE-DM 6.25-15 MG/5ML PO SYRP
ORAL_SOLUTION | ORAL | 1 refills | Status: AC
  Filled 2021-02-01: qty 100, 5d supply, fill #0

## 2021-02-05 ENCOUNTER — Other Ambulatory Visit (HOSPITAL_COMMUNITY): Payer: Self-pay

## 2021-02-20 DIAGNOSIS — F324 Major depressive disorder, single episode, in partial remission: Secondary | ICD-10-CM | POA: Diagnosis not present

## 2021-02-20 DIAGNOSIS — M170 Bilateral primary osteoarthritis of knee: Secondary | ICD-10-CM | POA: Diagnosis not present

## 2021-02-20 DIAGNOSIS — I10 Essential (primary) hypertension: Secondary | ICD-10-CM | POA: Diagnosis not present

## 2021-02-20 DIAGNOSIS — E119 Type 2 diabetes mellitus without complications: Secondary | ICD-10-CM | POA: Diagnosis not present

## 2021-02-20 DIAGNOSIS — E782 Mixed hyperlipidemia: Secondary | ICD-10-CM | POA: Diagnosis not present

## 2021-02-20 DIAGNOSIS — N3281 Overactive bladder: Secondary | ICD-10-CM | POA: Diagnosis not present

## 2021-02-20 DIAGNOSIS — J4520 Mild intermittent asthma, uncomplicated: Secondary | ICD-10-CM | POA: Diagnosis not present

## 2021-02-20 DIAGNOSIS — F419 Anxiety disorder, unspecified: Secondary | ICD-10-CM | POA: Diagnosis not present

## 2021-02-20 DIAGNOSIS — Z85048 Personal history of other malignant neoplasm of rectum, rectosigmoid junction, and anus: Secondary | ICD-10-CM | POA: Diagnosis not present

## 2021-02-25 ENCOUNTER — Other Ambulatory Visit (HOSPITAL_COMMUNITY): Payer: Self-pay

## 2021-02-25 MED ORDER — ATORVASTATIN CALCIUM 10 MG PO TABS
ORAL_TABLET | ORAL | 6 refills | Status: DC
Start: 2021-02-25 — End: 2023-04-08
  Filled 2021-02-25: qty 30, 30d supply, fill #0
  Filled 2021-04-15: qty 30, 30d supply, fill #1
  Filled 2021-07-10: qty 30, 30d supply, fill #2

## 2021-04-15 ENCOUNTER — Other Ambulatory Visit (HOSPITAL_COMMUNITY): Payer: Self-pay

## 2021-05-15 ENCOUNTER — Other Ambulatory Visit (HOSPITAL_COMMUNITY): Payer: Self-pay

## 2021-05-16 ENCOUNTER — Other Ambulatory Visit (HOSPITAL_COMMUNITY): Payer: Self-pay

## 2021-05-21 ENCOUNTER — Other Ambulatory Visit (HOSPITAL_COMMUNITY): Payer: Self-pay

## 2021-06-25 DIAGNOSIS — H43393 Other vitreous opacities, bilateral: Secondary | ICD-10-CM | POA: Diagnosis not present

## 2021-06-25 DIAGNOSIS — H2513 Age-related nuclear cataract, bilateral: Secondary | ICD-10-CM | POA: Diagnosis not present

## 2021-07-10 ENCOUNTER — Other Ambulatory Visit (HOSPITAL_COMMUNITY): Payer: Self-pay

## 2021-07-31 ENCOUNTER — Other Ambulatory Visit (HOSPITAL_COMMUNITY): Payer: Self-pay

## 2021-07-31 MED ORDER — ALBUTEROL SULFATE HFA 108 (90 BASE) MCG/ACT IN AERS
INHALATION_SPRAY | RESPIRATORY_TRACT | 0 refills | Status: DC
  Filled 2021-07-31: qty 8.5, 25d supply, fill #0

## 2021-08-01 ENCOUNTER — Other Ambulatory Visit (HOSPITAL_COMMUNITY): Payer: Self-pay

## 2021-08-22 DIAGNOSIS — N3281 Overactive bladder: Secondary | ICD-10-CM | POA: Diagnosis not present

## 2021-08-22 DIAGNOSIS — J4520 Mild intermittent asthma, uncomplicated: Secondary | ICD-10-CM | POA: Diagnosis not present

## 2021-08-22 DIAGNOSIS — Z0000 Encounter for general adult medical examination without abnormal findings: Secondary | ICD-10-CM | POA: Diagnosis not present

## 2021-08-22 DIAGNOSIS — I10 Essential (primary) hypertension: Secondary | ICD-10-CM | POA: Diagnosis not present

## 2021-08-22 DIAGNOSIS — F324 Major depressive disorder, single episode, in partial remission: Secondary | ICD-10-CM | POA: Diagnosis not present

## 2021-08-22 DIAGNOSIS — E119 Type 2 diabetes mellitus without complications: Secondary | ICD-10-CM | POA: Diagnosis not present

## 2021-08-22 DIAGNOSIS — F419 Anxiety disorder, unspecified: Secondary | ICD-10-CM | POA: Diagnosis not present

## 2021-08-22 DIAGNOSIS — E7800 Pure hypercholesterolemia, unspecified: Secondary | ICD-10-CM | POA: Diagnosis not present

## 2021-08-22 DIAGNOSIS — M25512 Pain in left shoulder: Secondary | ICD-10-CM | POA: Diagnosis not present

## 2021-08-22 DIAGNOSIS — Z85048 Personal history of other malignant neoplasm of rectum, rectosigmoid junction, and anus: Secondary | ICD-10-CM | POA: Diagnosis not present

## 2021-10-31 ENCOUNTER — Other Ambulatory Visit (HOSPITAL_BASED_OUTPATIENT_CLINIC_OR_DEPARTMENT_OTHER): Payer: Self-pay

## 2021-12-27 ENCOUNTER — Other Ambulatory Visit (HOSPITAL_COMMUNITY): Payer: Self-pay

## 2021-12-30 ENCOUNTER — Other Ambulatory Visit (HOSPITAL_COMMUNITY): Payer: Self-pay

## 2021-12-30 MED ORDER — ALBUTEROL SULFATE HFA 108 (90 BASE) MCG/ACT IN AERS
INHALATION_SPRAY | RESPIRATORY_TRACT | 0 refills | Status: AC
  Filled 2021-12-30: qty 8.5, 25d supply, fill #0

## 2022-01-03 ENCOUNTER — Other Ambulatory Visit (HOSPITAL_COMMUNITY): Payer: Self-pay

## 2022-04-18 DIAGNOSIS — M25519 Pain in unspecified shoulder: Secondary | ICD-10-CM | POA: Diagnosis not present

## 2022-04-18 DIAGNOSIS — N3281 Overactive bladder: Secondary | ICD-10-CM | POA: Diagnosis not present

## 2022-04-18 DIAGNOSIS — Z85048 Personal history of other malignant neoplasm of rectum, rectosigmoid junction, and anus: Secondary | ICD-10-CM | POA: Diagnosis not present

## 2022-04-18 DIAGNOSIS — I10 Essential (primary) hypertension: Secondary | ICD-10-CM | POA: Diagnosis not present

## 2022-04-18 DIAGNOSIS — J4520 Mild intermittent asthma, uncomplicated: Secondary | ICD-10-CM | POA: Diagnosis not present

## 2022-04-18 DIAGNOSIS — E119 Type 2 diabetes mellitus without complications: Secondary | ICD-10-CM | POA: Diagnosis not present

## 2022-04-18 DIAGNOSIS — F324 Major depressive disorder, single episode, in partial remission: Secondary | ICD-10-CM | POA: Diagnosis not present

## 2022-04-18 DIAGNOSIS — E7800 Pure hypercholesterolemia, unspecified: Secondary | ICD-10-CM | POA: Diagnosis not present

## 2022-05-07 ENCOUNTER — Other Ambulatory Visit (HOSPITAL_COMMUNITY): Payer: Self-pay

## 2022-05-07 DIAGNOSIS — H2513 Age-related nuclear cataract, bilateral: Secondary | ICD-10-CM | POA: Diagnosis not present

## 2022-05-07 DIAGNOSIS — H43393 Other vitreous opacities, bilateral: Secondary | ICD-10-CM | POA: Diagnosis not present

## 2022-06-13 DIAGNOSIS — M542 Cervicalgia: Secondary | ICD-10-CM | POA: Diagnosis not present

## 2022-06-13 DIAGNOSIS — M25511 Pain in right shoulder: Secondary | ICD-10-CM | POA: Diagnosis not present

## 2022-06-13 DIAGNOSIS — M25512 Pain in left shoulder: Secondary | ICD-10-CM | POA: Diagnosis not present

## 2022-06-20 DIAGNOSIS — M19011 Primary osteoarthritis, right shoulder: Secondary | ICD-10-CM | POA: Diagnosis not present

## 2022-10-30 DIAGNOSIS — E782 Mixed hyperlipidemia: Secondary | ICD-10-CM | POA: Diagnosis not present

## 2022-10-30 DIAGNOSIS — E119 Type 2 diabetes mellitus without complications: Secondary | ICD-10-CM | POA: Diagnosis not present

## 2022-10-30 DIAGNOSIS — F324 Major depressive disorder, single episode, in partial remission: Secondary | ICD-10-CM | POA: Diagnosis not present

## 2022-10-30 DIAGNOSIS — I10 Essential (primary) hypertension: Secondary | ICD-10-CM | POA: Diagnosis not present

## 2022-10-30 DIAGNOSIS — M1990 Unspecified osteoarthritis, unspecified site: Secondary | ICD-10-CM | POA: Diagnosis not present

## 2022-10-30 DIAGNOSIS — N3281 Overactive bladder: Secondary | ICD-10-CM | POA: Diagnosis not present

## 2022-10-30 DIAGNOSIS — Z85048 Personal history of other malignant neoplasm of rectum, rectosigmoid junction, and anus: Secondary | ICD-10-CM | POA: Diagnosis not present

## 2022-10-30 DIAGNOSIS — Z0000 Encounter for general adult medical examination without abnormal findings: Secondary | ICD-10-CM | POA: Diagnosis not present

## 2022-10-30 DIAGNOSIS — J4520 Mild intermittent asthma, uncomplicated: Secondary | ICD-10-CM | POA: Diagnosis not present

## 2023-02-27 DIAGNOSIS — G5601 Carpal tunnel syndrome, right upper limb: Secondary | ICD-10-CM | POA: Diagnosis not present

## 2023-02-27 DIAGNOSIS — F324 Major depressive disorder, single episode, in partial remission: Secondary | ICD-10-CM | POA: Diagnosis not present

## 2023-03-02 ENCOUNTER — Ambulatory Visit (INDEPENDENT_AMBULATORY_CARE_PROVIDER_SITE_OTHER): Payer: Medicare PPO | Admitting: Gastroenterology

## 2023-03-02 ENCOUNTER — Other Ambulatory Visit: Payer: Self-pay | Admitting: Gastroenterology

## 2023-03-02 ENCOUNTER — Encounter: Payer: Self-pay | Admitting: Gastroenterology

## 2023-03-02 VITALS — BP 130/80 | HR 94 | Ht 60.0 in | Wt 181.0 lb

## 2023-03-02 DIAGNOSIS — Z85038 Personal history of other malignant neoplasm of large intestine: Secondary | ICD-10-CM

## 2023-03-02 MED ORDER — PLENVU 140 G PO SOLR: 1.0000 | Freq: Once | ORAL | 0 refills | Status: DC

## 2023-03-02 NOTE — Telephone Encounter (Signed)
We are giving the patient a Plenvu sample kit    Called patient to inform

## 2023-03-02 NOTE — Patient Instructions (Signed)
You have been scheduled for a colonoscopy. Please follow written instructions given to you at your visit today.  Please pick up your prep supplies at the pharmacy within the next 1-3 days. If you use inhalers (even only as needed), please bring them with you on the day of your procedure.  _______________________________________________________  If your blood pressure at your visit was 140/90 or greater, please contact your primary care physician to follow up on this.  _______________________________________________________  If you are age 25 or older, your body mass index should be between 23-30. Your Body mass index is 35.35 kg/m. If this is out of the aforementioned range listed, please consider follow up with your Primary Care Provider.  If you are age 78 or younger, your body mass index should be between 19-25. Your Body mass index is 35.35 kg/m. If this is out of the aformentioned range listed, please consider follow up with your Primary Care Provider.   ________________________________________________________  The [facility] providers would like to encourage you to use [facility] to communicate with providers for non-urgent requests or questions.  Due to long hold times on the telephone, sending your provider a message by [facility] may be a faster and more efficient way to get a response.  Please allow 48 business hours for a response.  Please remember that this is for non-urgent requests.  _______________________________________________________

## 2023-03-02 NOTE — Progress Notes (Signed)
Assessment    Personal history of T2, N0 rectal cancer in 2004, S/P LAR    Recommendations   Schedule colonoscopy. The risks (including bleeding, perforation, infection, missed lesions, medication reactions and possible hospitalization or surgery if complications occur), benefits, and alternatives to colonoscopy with possible biopsy and possible polypectomy were discussed with the patient and they consent to proceed.     HPI   Chief complaint: Personal history of rectal cancer  Patient profile:  MATTILYN HUENINK is a 72 y.o. female referred by Johny Blamer, MD for surveillance colonoscopy.  She was diagnosed with rectal cancer in 2004 and is S/P low anterior resection in 2004.  Staging was T2, N0.  Her last colonoscopy as below.  She has no gastrointestinal complaints. Denies weight loss, abdominal pain, constipation, diarrhea, change in stool caliber, melena, hematochezia, nausea, vomiting, dysphagia, reflux symptoms, chest pain.   Previous Labs / Imaging::    Previous GI evaluation    Endoscopies:  Colonoscopy April 2019 - Two 6 mm polyps in the descending colon and in the transverse colon, removed with a cold snare. Resected and retrieved.  - Patent end-to-end colo-colonic anastomosis, characterized by healthy appearing mucosa.  - The examination was otherwise normal on direct and retroflexion views.  Imaging:     Past Medical History:  Diagnosis Date   Allergic rhinitis    Anxiety    Arthritis    Asthma    Bilateral shoulder pain    Cataract    Bilateral   Depression    GERD (gastroesophageal reflux disease)    History of bronchitis    History of colon polyps    Hypertension    Pre-diabetes    Rectal cancer ([facility])    Dx 2004   Uterine fibroid 2005   Past Surgical History:  Procedure Laterality Date   APPENDECTOMY  2004   CATARACT EXTRACTION, BILATERAL     CESAREAN SECTION     x2   COLONOSCOPY  2019   LOW ANTERIOR BOWEL RESECTION  2004   For  rectal cancer   TOTAL KNEE ARTHROPLASTY Left 02/28/2019   Procedure: TOTAL KNEE ARTHROPLASTY;  Surgeon: Ollen Gross, MD;  Location: [facility];  Service: Orthopedics;  Laterality: Left;   TOTAL KNEE ARTHROPLASTY Right 06/20/2019   Procedure: TOTAL KNEE ARTHROPLASTY;  Surgeon: Ollen Gross, MD;  Location: [facility];  Service: Orthopedics;  Laterality: Right;    Family History  Problem Relation Age of Onset   Colon cancer Other        great aunt   Kidney failure Mother        End-stage renal failure was on dialysis.   Congestive Heart Failure Mother 33   CAD Mother    Hypertension Mother    Other Father        Unknown   Healthy Sister    Prostate cancer Brother    Multiple myeloma Maternal Grandmother    Prostate cancer Brother    Glaucoma Brother    Healthy Brother    Stomach cancer Neg Hx    Breast cancer Neg Hx    Esophageal cancer Neg Hx    Liver cancer Neg Hx    Pancreatic cancer Neg Hx    Rectal cancer Neg Hx    Social History   Tobacco Use   Smoking status: Never   Smokeless tobacco: Never  Vaping Use   Vaping Use: Never used  Substance Use Topics   Alcohol use: Yes    Comment:  Socially wine and beer    Drug use: No   Current Outpatient Medications  Medication Sig Dispense Refill   ADVAIR DISKUS 250-50 MCG/DOSE AEPB Inhale 1 puff into the lungs daily as needed (shortness of breath).      albuterol (VENTOLIN HFA) 108 (90 BASE) MCG/ACT inhaler Inhale 2 puffs into the lungs every 4 (four) hours as needed. (Patient taking differently: Inhale 2 puffs into the lungs every 4 (four) hours as needed for wheezing or shortness of breath. ) 18 g 5   albuterol (VENTOLIN HFA) 108 (90 Base) MCG/ACT inhaler Inhale 2 puffs into the lungs 4 times a day as needed for wheezing 8.5 g 0   atorvastatin (LIPITOR) 10 MG tablet Take 1 tablet by mouth once a day 30 tablet 6   atorvastatin (LIPITOR) 40 MG tablet Take 40 mg by mouth daily.     buPROPion (WELLBUTRIN XL) 300 MG 24 hr tablet  Take 300 mg by mouth daily.     gabapentin (NEURONTIN) 300 MG capsule Take 1 capsule (300 mg total) by mouth 3 (three) times daily. Take a 300 mg capsule three times a day for two weeks following surgery.Then take a 300 mg capsule two times a day for two weeks. Then take a 300 mg capsule once a day for two weeks. Then discontinue the Gabapentin. 84 capsule 0   HYDROmorphone (DILAUDID) 2 MG tablet Take 1-2 tablets (2-4 mg total) by mouth every 6 (six) hours as needed for moderate pain. 56 tablet 0   methocarbamol (ROBAXIN) 500 MG tablet Take 1 tablet (500 mg total) by mouth every 6 (six) hours as needed for muscle spasms. 40 tablet 0   mirabegron ER (MYRBETRIQ) 50 MG TB24 tablet TAKE 1 TABLET BY MOUTH DAILY 30 tablet 6   PARoxetine (PAXIL) 20 MG tablet Take 30 mg by mouth daily.     promethazine-dextromethorphan (PROMETHAZINE-DM) 6.25-15 MG/5ML syrup Take 5 ml by mouth every 6 hours as needed for 5 days 100 mL 1   rivaroxaban (XARELTO) 10 MG TABS tablet Take 1 tablet (10 mg total) by mouth daily with breakfast for 19 days. Take one tablet Xarelto once a day for three weeks following surgery. Then change to one baby Aspirin (81 mg) once a day for three weeks. Then discontinue Aspirin. 19 tablet 0   trolamine salicylate (ASPERCREME) 10 % cream Apply 1 application topically as needed for muscle pain.     No current facility-administered medications for this visit.   Allergies  Allergen Reactions   Shellfish Allergy Anaphylaxis   Morphine Itching    CAN TAKE WITH BENDRYL   Other     Trees, Grass, Rag weed    Review of Systems: All other systems reviewed and negative except where noted in HPI.    Physical Exam    Wt Readings from Last 3 Encounters:  06/20/19 180 lb (81.6 kg)  06/14/19 180 lb (81.6 kg)  02/28/19 187 lb (84.8 kg)   Constitutional:  Generally well appearing female in no acute distress. HEENT: Pupils normal.  Conjunctivae are normal. No scleral icterus. No oral lesions or  deformities noted.  Neck: Supple.  Cardiac: Normal rate, regular rhythm without murmurs. Pulmonary/chest: Effort normal and breath sounds normal. No wheezing, rales or rhonchi. Abdominal: Soft, nondistended, nontender. Active bowel sounds. No palpable HSM, masses or hernias. Rectal: Deferred to colonoscopy  Extremities: No edema or deformities noted Neurological: Alert and oriented to person, place and time. Psychiatric: Pleasant. Normal mood and affect. Behavior is normal.  Skin: Skin is warm and dry. No rashes noted.  Claudette Head, MD   cc:  Referring Provider Johny Blamer, MD

## 2023-03-05 ENCOUNTER — Other Ambulatory Visit (HOSPITAL_COMMUNITY): Payer: Self-pay

## 2023-03-05 DIAGNOSIS — G5601 Carpal tunnel syndrome, right upper limb: Secondary | ICD-10-CM | POA: Diagnosis not present

## 2023-03-05 DIAGNOSIS — M79641 Pain in right hand: Secondary | ICD-10-CM | POA: Diagnosis not present

## 2023-03-05 MED ORDER — METHYLPREDNISOLONE 4 MG PO TBPK
ORAL_TABLET | ORAL | 0 refills | Status: AC
  Filled 2023-03-05: qty 21, 6d supply, fill #0

## 2023-03-22 DIAGNOSIS — F332 Major depressive disorder, recurrent severe without psychotic features: Secondary | ICD-10-CM | POA: Diagnosis not present

## 2023-04-02 DIAGNOSIS — G5601 Carpal tunnel syndrome, right upper limb: Secondary | ICD-10-CM | POA: Diagnosis not present

## 2023-04-08 ENCOUNTER — Encounter: Payer: Self-pay | Admitting: Gastroenterology

## 2023-04-08 ENCOUNTER — Ambulatory Visit (AMBULATORY_SURGERY_CENTER): Payer: Medicare PPO | Admitting: Gastroenterology

## 2023-04-08 ENCOUNTER — Encounter: Payer: Medicare PPO | Admitting: Gastroenterology

## 2023-04-08 VITALS — BP 118/68 | HR 100 | Temp 97.5°F | Resp 13 | Ht 60.0 in | Wt 181.0 lb

## 2023-04-08 DIAGNOSIS — J45909 Unspecified asthma, uncomplicated: Secondary | ICD-10-CM | POA: Diagnosis not present

## 2023-04-08 DIAGNOSIS — Z85038 Personal history of other malignant neoplasm of large intestine: Secondary | ICD-10-CM

## 2023-04-08 DIAGNOSIS — F32A Depression, unspecified: Secondary | ICD-10-CM | POA: Diagnosis not present

## 2023-04-08 DIAGNOSIS — F419 Anxiety disorder, unspecified: Secondary | ICD-10-CM | POA: Diagnosis not present

## 2023-04-08 DIAGNOSIS — Z1211 Encounter for screening for malignant neoplasm of colon: Secondary | ICD-10-CM | POA: Diagnosis not present

## 2023-04-08 DIAGNOSIS — I10 Essential (primary) hypertension: Secondary | ICD-10-CM | POA: Diagnosis not present

## 2023-04-08 DIAGNOSIS — Z08 Encounter for follow-up examination after completed treatment for malignant neoplasm: Secondary | ICD-10-CM

## 2023-04-08 DIAGNOSIS — R7303 Prediabetes: Secondary | ICD-10-CM | POA: Diagnosis not present

## 2023-04-08 MED ORDER — SODIUM CHLORIDE 0.9 % IV SOLN: 500.0000 mL | Freq: Once | INTRAVENOUS | Status: DC

## 2023-04-08 NOTE — Patient Instructions (Signed)
Discharge instructions given. Normal exam. Resume previous medications. YOU HAD AN ENDOSCOPIC PROCEDURE TODAY AT THE [facility]:   Refer to the procedure report that was given to you for any specific questions about what was found during the examination.  If the procedure report does not answer your questions, please call your gastroenterologist to clarify.  If you requested that your care partner not be given the details of your procedure findings, then the procedure report has been included in a sealed envelope for you to review at your convenience later.  YOU SHOULD EXPECT: Some feelings of bloating in the abdomen. Passage of more gas than usual.  Walking can help get rid of the air that was put into your GI tract during the procedure and reduce the bloating. If you had a lower endoscopy (such as a colonoscopy or flexible sigmoidoscopy) you may notice spotting of blood in your stool or on the toilet paper. If you underwent a bowel prep for your procedure, you may not have a normal bowel movement for a few days.  Please Note:  You might notice some irritation and congestion in your nose or some drainage.  This is from the oxygen used during your procedure.  There is no need for concern and it should clear up in a day or so.  SYMPTOMS TO REPORT IMMEDIATELY:  Following lower endoscopy (colonoscopy or flexible sigmoidoscopy):  Excessive amounts of blood in the stool  Significant tenderness or worsening of abdominal pains  Swelling of the abdomen that is new, acute  Fever of 100F or higher   For urgent or emergent issues, a gastroenterologist can be reached at any hour by calling [phone]. Do not use MyChart messaging for urgent concerns.    DIET:  We do recommend a small meal at first, but then you may proceed to your regular diet.  Drink plenty of fluids but you should avoid alcoholic beverages for 24 hours.  ACTIVITY:  You should plan to take it easy for the rest of  today and you should NOT DRIVE or use heavy machinery until tomorrow (because of the sedation medicines used during the test).    FOLLOW UP: Our staff will call the number listed on your records the next business day following your procedure.  We will call around 7:15- 8:00 am to check on you and address any questions or concerns that you may have regarding the information given to you following your procedure. If we do not reach you, we will leave a message.     If any biopsies were taken you will be contacted by phone or by letter within the next 1-3 weeks.  Please call us at [phone] if you have not heard about the biopsies in 3 weeks.    SIGNATURES/CONFIDENTIALITY: You and/or your care partner have signed paperwork which will be entered into your electronic medical record.  These signatures attest to the fact that that the information above on your After Visit Summary has been reviewed and is understood.  Full responsibility of the confidentiality of this discharge information lies with you and/or your care-partner. 

## 2023-04-08 NOTE — Progress Notes (Signed)
 Vitals-DT  Pt's states no medical or surgical changes since previsit or office visit.  

## 2023-04-08 NOTE — Progress Notes (Signed)
Uneventful propofol anesthetic. Report to pacu rn. Vss. Care resumed by rn.

## 2023-04-08 NOTE — Progress Notes (Signed)
History & Physical  Primary Care Physician:  Noberto Retort, MD Primary Gastroenterologist: Claudette Head, MD  Impression / Plan:  Personal history of rectal cancer, T2 N0 in 2004 status post LAR for colonoscopy.  CHIEF COMPLAINT:  Personal history of rectal cancer  HPI: Jane [name] is a 72 y.o. female with a personal history of rectal cancer, T2 N0 in 2004 status post LAR for colonoscopy.   Past Medical History:  Diagnosis Date   Allergic rhinitis    Anxiety    Arthritis    Asthma    Bilateral shoulder pain    Cataract    Bilateral   Depression    GERD (gastroesophageal reflux disease)    History of bronchitis    History of colon polyps    Hypertension    Pre-diabetes    Rectal cancer (HCC)    Dx 2004   Uterine fibroid 2005    Past Surgical History:  Procedure Laterality Date   APPENDECTOMY  2004   CATARACT EXTRACTION, BILATERAL     CESAREAN SECTION     x2   COLONOSCOPY  2019   LOW ANTERIOR BOWEL RESECTION  2004   For rectal cancer   TOTAL KNEE ARTHROPLASTY Left 02/28/2019   Procedure: TOTAL KNEE ARTHROPLASTY;  Surgeon: Ollen Gross, MD;  Location: [facility];  Service: Orthopedics;  Laterality: Left;   TOTAL KNEE ARTHROPLASTY Right 06/20/2019   Procedure: TOTAL KNEE ARTHROPLASTY;  Surgeon: Ollen Gross, MD;  Location: [facility];  Service: Orthopedics;  Laterality: Right;     Prior to Admission medications   Medication Sig Start Date End Date Taking? Authorizing Provider  acetaminophen (TYLENOL) 500 MG tablet Pain Relief Extra Strength 500 mg tablet  TAKE TWO TABLETS BY MOUTH EVERY 8 HOURS AS NEEDED FOR PAIN   Yes [provider]  ADVAIR DISKUS 250-50 MCG/DOSE AEPB Inhale 1 puff into the lungs daily as needed (shortness of breath).  09/26/15  Yes [provider]  Biotin 16109 MCG TABS Take by mouth.   Yes [provider]  buPROPion (WELLBUTRIN XL) 300 MG 24 hr tablet Take 300 mg by mouth daily.   Yes [provider]   NON FORMULARY Apple cider gummies 1 a day   Yes [provider]  PARoxetine (PAXIL) 20 MG tablet Take 30 mg by mouth daily.   Yes [provider]  albuterol (VENTOLIN HFA) 108 (90 BASE) MCG/ACT inhaler Inhale 2 puffs into the lungs every 4 (four) hours as needed. Patient taking differently: Inhale 2 puffs into the lungs every 4 (four) hours as needed for wheezing or shortness of breath. 08/02/12   Parrett, Virgel Bouquet, NP  albuterol (VENTOLIN HFA) 108 (90 Base) MCG/ACT inhaler Inhale 2 puffs into the lungs 4 times a day as needed for wheezing Patient not taking: Reported on 03/02/2023 12/30/21     amoxicillin (AMOXIL) 500 MG tablet amoxicillin 500 mg tablet  4 tablets 1 hour prior to dental procedure    [provider]  atorvastatin (LIPITOR) 40 MG tablet Take 40 mg by mouth daily. Patient not taking: Reported on 04/08/2023    [provider]  meloxicam (MOBIC) 15 MG tablet TAKE 1 TABLET BY MOUTH EVERY DAY FOR ARTHRITIS PAIN 30 DAYS 02/18/23   [provider]  methocarbamol (ROBAXIN) 500 MG tablet Take 1 tablet (500 mg total) by mouth every 6 (six) hours as needed for muscle spasms. Patient not taking: Reported on 03/02/2023 06/22/19   Cassandria Anger, PA-C  methylPREDNISolone (MEDROL DOSEPAK) 4 MG TBPK tablet Take As Directed On Package 03/05/23     mirabegron ER (MYRBETRIQ) 50 MG TB24 tablet TAKE 1 TABLET BY MOUTH DAILY 08/22/20 08/22/21  Noberto Retort, MD  promethazine-dextromethorphan (PROMETHAZINE-DM) 6.25-15 MG/5ML syrup Take 5 ml by mouth every 6 hours as needed for 5 days 02/01/21     trolamine salicylate (ASPERCREME) 10 % cream Apply 1 application topically as needed for muscle pain.    [provider]    Current Outpatient Medications  Medication Sig Dispense Refill   acetaminophen (TYLENOL) 500 MG tablet Pain Relief Extra Strength 500 mg tablet  TAKE TWO TABLETS BY MOUTH EVERY 8 HOURS AS NEEDED FOR PAIN     ADVAIR DISKUS 250-50  MCG/DOSE AEPB Inhale 1 puff into the lungs daily as needed (shortness of breath).      Biotin 01027 MCG TABS Take by mouth.     buPROPion (WELLBUTRIN XL) 300 MG 24 hr tablet Take 300 mg by mouth daily.     NON FORMULARY Apple cider gummies 1 a day     PARoxetine (PAXIL) 20 MG tablet Take 30 mg by mouth daily.     albuterol (VENTOLIN HFA) 108 (90 BASE) MCG/ACT inhaler Inhale 2 puffs into the lungs every 4 (four) hours as needed. (Patient taking differently: Inhale 2 puffs into the lungs every 4 (four) hours as needed for wheezing or shortness of breath.) 18 g 5   albuterol (VENTOLIN HFA) 108 (90 Base) MCG/ACT inhaler Inhale 2 puffs into the lungs 4 times a day as needed for wheezing (Patient not taking: Reported on 03/02/2023) 8.5 g 0   amoxicillin (AMOXIL) 500 MG tablet amoxicillin 500 mg tablet  4 tablets 1 hour prior to dental procedure     atorvastatin (LIPITOR) 40 MG tablet Take 40 mg by mouth daily. (Patient not taking: Reported on 04/08/2023)     meloxicam (MOBIC) 15 MG tablet TAKE 1 TABLET BY MOUTH EVERY DAY FOR ARTHRITIS PAIN 30 DAYS     methocarbamol (ROBAXIN) 500 MG tablet Take 1 tablet (500 mg total) by mouth every 6 (six) hours as needed for muscle spasms. (Patient not taking: Reported on 03/02/2023) 40 tablet 0   methylPREDNISolone (MEDROL DOSEPAK) 4 MG TBPK tablet Take As Directed On Package 21 tablet 0   mirabegron ER (MYRBETRIQ) 50 MG TB24 tablet TAKE 1 TABLET BY MOUTH DAILY 30 tablet 6   promethazine-dextromethorphan (PROMETHAZINE-DM) 6.25-15 MG/5ML syrup Take 5 ml by mouth every 6 hours as needed for 5 days 100 mL 1   trolamine salicylate (ASPERCREME) 10 % cream Apply 1 application topically as needed for muscle pain.     Current Facility-Administered Medications  Medication Dose Route Frequency Provider Last Rate Last Admin   0.9 %  sodium chloride infusion  500 mL Intravenous Once Meryl Dare, MD        Allergies as of 04/08/2023 - Review Complete 04/08/2023  Allergen  Reaction Noted   Shellfish allergy Anaphylaxis 06/20/2019   Morphine Itching    Other  02/24/2019    Family History  Problem Relation Age of Onset   Colon cancer Other        great aunt   Kidney failure Mother        End-stage renal failure was on dialysis.   Congestive Heart Failure Mother 5   CAD Mother    Hypertension Mother    Other Father        Unknown   Healthy Sister  Prostate cancer Brother    Multiple myeloma Maternal Grandmother    Prostate cancer Brother    Glaucoma Brother    Healthy Brother    Stomach cancer Neg Hx    Breast cancer Neg Hx    Esophageal cancer Neg Hx    Liver cancer Neg Hx    Pancreatic cancer Neg Hx    Rectal cancer Neg Hx     Social History   Socioeconomic History   Marital status: Divorced    Spouse name: Not on file   Number of children: 2   Years of education: 12   Highest education level: High school graduate  Occupational History   Occupation: retired  Tobacco Use   Smoking status: Never   Smokeless tobacco: Never  Vaping Use   Vaping status: Never Used  Substance and Sexual Activity   Alcohol use: Yes    Comment: Socially wine and beer    Drug use: No   Sexual activity: Not on file  Other Topics Concern   Not on file  Social History Narrative   Divorced mother of 2 (1 daughter 1 son.  With 1 grandchild Agricultural engineer).  She lives alone.     1 glass of wine or beers per week.   She does water aerobics for 45 minutes at a time 2 to 3 days a week.  She also does home PT   Social Determinants of Health   Financial Resource Strain: Not on file  Food Insecurity: Not on file  Transportation Needs: Not on file  Physical Activity: Not on file  Stress: Not on file  Social Connections: Not on file  Intimate Partner Violence: Not on file    Review of Systems:  All systems reviewed were negative except where noted in HPI.   Physical Exam:  General:  Alert, well-developed, in NAD Head:  Normocephalic and  atraumatic. Eyes:  Sclera clear, no icterus.   Conjunctiva pink. Ears:  Normal auditory acuity. Mouth:  No deformity or lesions.  Neck:  Supple; no masses. Lungs:  Clear throughout to auscultation.   No wheezes, crackles, or rhonchi.  Heart:  Regular rate and rhythm; no murmurs. Abdomen:  Soft, nondistended, nontender. No masses, hepatomegaly. No palpable masses.  Normal bowel sounds.    Rectal:  Deferred   Msk:  Symmetrical without gross deformities. Extremities:  Without edema. Neurologic:  Alert and  oriented x 4; grossly normal neurologically. Skin:  Intact without significant lesions or rashes. Psych:  Alert and cooperative. Normal mood and affect.   Venita Lick. Russella Dar  04/08/2023, 10:37 AM See Loretha Stapler,  GI, to contact our on call provider

## 2023-04-08 NOTE — Op Note (Signed)
[facility] Patient Name: Jane [name] Procedure Date: 04/08/2023 10:27 AM MRN: [id number] Endoscopist: Meryl Dare , MD, [phone] Age: 72 Referring MD:  Date of Birth: 1951/03/26 Gender: Female Account #: [id number] Procedure:                Colonoscopy Indications:              High risk colon cancer surveillance: Personal                            history of rectal cancer Medicines:                Monitored Anesthesia Care Procedure:                Pre-Anesthesia Assessment:                           - Prior to the procedure, a History and Physical                            was performed, and patient medications and                            allergies were reviewed. The patient's tolerance of                            previous anesthesia was also reviewed. The risks                            and benefits of the procedure and the sedation                            options and risks were discussed with the patient.                            All questions were answered, and informed consent                            was obtained. Prior Anticoagulants: The patient has                            taken no anticoagulant or antiplatelet agents. ASA                            Grade Assessment: II - A patient with mild systemic                            disease. After reviewing the risks and benefits,                            the patient was deemed in satisfactory condition to                            undergo the procedure.  After obtaining informed consent, the colonoscope                            was passed under direct vision. Throughout the                            procedure, the patient's blood pressure, pulse, and                            oxygen saturations were monitored continuously. The                            Olympus Scope [id number] was introduced through the                            anus and advanced to the the  cecum, identified by                            appendiceal orifice and ileocecal valve. The                            ileocecal valve, appendiceal orifice, and rectum                            were photographed. The quality of the bowel                            preparation was good. The colonoscopy was performed                            without difficulty. The patient tolerated the                            procedure well. Unable to safely retroflex d/t                            prior surgery. Scope In: 10:44:22 AM Scope Out: 10:55:59 AM Scope Withdrawal Time: 0 hours 8 minutes 30 seconds  Total Procedure Duration: 0 hours 11 minutes 37 seconds  Findings:                 The perianal and digital rectal examinations were                            normal.                           There was evidence of a prior end-to-end                            colo-colonic anastomosis in the recto-sigmoid                            colon. This was patent and was characterized by  healthy appearing mucosa. The anastomosis was                            traversed.                           The exam was otherwise without abnormality on                            direct views. Complications:            No immediate complications. Estimated blood loss:                            None. Estimated Blood Loss:     Estimated blood loss: none. Impression:               - Patent end-to-end colo-colonic anastomosis,                            characterized by healthy appearing mucosa.                           - The examination was otherwise normal on direct                            views.                           - No specimens collected. Recommendation:           - Repeat colonoscopy in 5 years for surveillance.                           - Patient has a contact number available for                            emergencies. The signs and symptoms of potential                             delayed complications were discussed with the                            patient. Return to normal activities tomorrow.                            Written discharge instructions were provided to the                            patient.                           - Resume previous diet.                           - Continue present medications. Meryl Dare, MD 04/08/2023 11:00:59 AM This report has been signed electronically.

## 2023-04-09 ENCOUNTER — Telehealth: Payer: Self-pay

## 2023-04-09 NOTE — Telephone Encounter (Signed)
  Follow up Call-     04/08/2023    9:28 AM 04/08/2023    9:23 AM  Call back number  Post procedure Call Back phone  # [phone]   Permission to leave phone message  Yes     Patient questions:  Do you have a fever, pain , or abdominal swelling? No. Pain Score  0 *  Have you tolerated food without any problems? Yes.    Have you been able to return to your normal activities? Yes.    Do you have any questions about your discharge instructions: Diet   No. Medications  No. Follow up visit  No.  Do you have questions or concerns about your Care? No.  Actions: * If pain score is 4 or above: No action needed, pain <4.

## 2023-05-05 DIAGNOSIS — F324 Major depressive disorder, single episode, in partial remission: Secondary | ICD-10-CM | POA: Diagnosis not present

## 2023-05-05 DIAGNOSIS — I10 Essential (primary) hypertension: Secondary | ICD-10-CM | POA: Diagnosis not present

## 2023-05-05 DIAGNOSIS — E119 Type 2 diabetes mellitus without complications: Secondary | ICD-10-CM | POA: Diagnosis not present

## 2023-05-05 DIAGNOSIS — Z85048 Personal history of other malignant neoplasm of rectum, rectosigmoid junction, and anus: Secondary | ICD-10-CM | POA: Diagnosis not present

## 2023-05-05 DIAGNOSIS — J4520 Mild intermittent asthma, uncomplicated: Secondary | ICD-10-CM | POA: Diagnosis not present

## 2023-05-05 DIAGNOSIS — E782 Mixed hyperlipidemia: Secondary | ICD-10-CM | POA: Diagnosis not present

## 2023-05-18 ENCOUNTER — Other Ambulatory Visit (HOSPITAL_COMMUNITY): Payer: Self-pay

## 2023-05-18 DIAGNOSIS — Z01419 Encounter for gynecological examination (general) (routine) without abnormal findings: Secondary | ICD-10-CM | POA: Diagnosis not present

## 2023-05-18 DIAGNOSIS — L304 Erythema intertrigo: Secondary | ICD-10-CM | POA: Diagnosis not present

## 2023-05-18 DIAGNOSIS — E2839 Other primary ovarian failure: Secondary | ICD-10-CM | POA: Diagnosis not present

## 2023-05-18 DIAGNOSIS — Z1231 Encounter for screening mammogram for malignant neoplasm of breast: Secondary | ICD-10-CM | POA: Diagnosis not present

## 2023-05-18 MED ORDER — NYSTATIN 100000 UNIT/GM EX POWD
CUTANEOUS | 0 refills | Status: AC
  Filled 2023-05-18: qty 30, 15d supply, fill #0

## 2023-05-19 ENCOUNTER — Other Ambulatory Visit: Payer: Self-pay | Admitting: Obstetrics and Gynecology

## 2023-05-19 DIAGNOSIS — E2839 Other primary ovarian failure: Secondary | ICD-10-CM

## 2023-05-20 ENCOUNTER — Other Ambulatory Visit (HOSPITAL_COMMUNITY): Payer: Self-pay

## 2023-07-23 DIAGNOSIS — F332 Major depressive disorder, recurrent severe without psychotic features: Secondary | ICD-10-CM | POA: Diagnosis not present

## 2023-08-22 DIAGNOSIS — F332 Major depressive disorder, recurrent severe without psychotic features: Secondary | ICD-10-CM | POA: Diagnosis not present

## 2023-08-25 ENCOUNTER — Other Ambulatory Visit (HOSPITAL_COMMUNITY): Payer: Self-pay

## 2023-08-25 MED ORDER — PAROXETINE HCL 30 MG PO TABS
30.0000 mg | ORAL_TABLET | Freq: Every morning | ORAL | 1 refills | Status: AC
  Filled 2023-08-25 – 2023-11-16 (×2): qty 90, 90d supply, fill #0
  Filled 2023-11-16: qty 30, 30d supply, fill #0

## 2023-09-22 DIAGNOSIS — F332 Major depressive disorder, recurrent severe without psychotic features: Secondary | ICD-10-CM | POA: Diagnosis not present

## 2023-10-23 DIAGNOSIS — F332 Major depressive disorder, recurrent severe without psychotic features: Secondary | ICD-10-CM | POA: Diagnosis not present

## 2023-11-13 DIAGNOSIS — J4520 Mild intermittent asthma, uncomplicated: Secondary | ICD-10-CM | POA: Diagnosis not present

## 2023-11-13 DIAGNOSIS — I10 Essential (primary) hypertension: Secondary | ICD-10-CM | POA: Diagnosis not present

## 2023-11-13 DIAGNOSIS — E782 Mixed hyperlipidemia: Secondary | ICD-10-CM | POA: Diagnosis not present

## 2023-11-13 DIAGNOSIS — E119 Type 2 diabetes mellitus without complications: Secondary | ICD-10-CM | POA: Diagnosis not present

## 2023-11-13 DIAGNOSIS — M19011 Primary osteoarthritis, right shoulder: Secondary | ICD-10-CM | POA: Diagnosis not present

## 2023-11-13 DIAGNOSIS — F324 Major depressive disorder, single episode, in partial remission: Secondary | ICD-10-CM | POA: Diagnosis not present

## 2023-11-13 DIAGNOSIS — Z85048 Personal history of other malignant neoplasm of rectum, rectosigmoid junction, and anus: Secondary | ICD-10-CM | POA: Diagnosis not present

## 2023-11-16 ENCOUNTER — Other Ambulatory Visit (HOSPITAL_COMMUNITY): Payer: Self-pay

## 2023-11-16 ENCOUNTER — Other Ambulatory Visit (HOSPITAL_BASED_OUTPATIENT_CLINIC_OR_DEPARTMENT_OTHER): Payer: Self-pay

## 2023-11-20 DIAGNOSIS — F332 Major depressive disorder, recurrent severe without psychotic features: Secondary | ICD-10-CM | POA: Diagnosis not present

## 2023-11-23 ENCOUNTER — Ambulatory Visit
Admission: RE | Admit: 2023-11-23 | Discharge: 2023-11-23 | Disposition: A | Discharge status: Home / Self Care | Payer: Medicare PPO | Source: Ambulatory Visit | Attending: Obstetrics and Gynecology

## 2023-11-23 DIAGNOSIS — N958 Other specified menopausal and perimenopausal disorders: Secondary | ICD-10-CM | POA: Diagnosis not present

## 2023-11-23 DIAGNOSIS — E2839 Other primary ovarian failure: Secondary | ICD-10-CM

## 2023-11-26 ENCOUNTER — Other Ambulatory Visit (HOSPITAL_COMMUNITY): Payer: Self-pay

## 2023-12-21 DIAGNOSIS — F332 Major depressive disorder, recurrent severe without psychotic features: Secondary | ICD-10-CM | POA: Diagnosis not present

## 2024-02-20 DIAGNOSIS — F330 Major depressive disorder, recurrent, mild: Secondary | ICD-10-CM | POA: Diagnosis not present

## 2024-03-21 DIAGNOSIS — F330 Major depressive disorder, recurrent, mild: Secondary | ICD-10-CM | POA: Diagnosis not present

## 2024-04-21 DIAGNOSIS — F330 Major depressive disorder, recurrent, mild: Secondary | ICD-10-CM | POA: Diagnosis not present

## 2024-05-19 DIAGNOSIS — Z85048 Personal history of other malignant neoplasm of rectum, rectosigmoid junction, and anus: Secondary | ICD-10-CM | POA: Diagnosis not present

## 2024-05-19 DIAGNOSIS — Z0000 Encounter for general adult medical examination without abnormal findings: Secondary | ICD-10-CM | POA: Diagnosis not present

## 2024-05-19 DIAGNOSIS — F324 Major depressive disorder, single episode, in partial remission: Secondary | ICD-10-CM | POA: Diagnosis not present

## 2024-05-19 DIAGNOSIS — E119 Type 2 diabetes mellitus without complications: Secondary | ICD-10-CM | POA: Diagnosis not present

## 2024-05-19 DIAGNOSIS — E782 Mixed hyperlipidemia: Secondary | ICD-10-CM | POA: Diagnosis not present

## 2024-05-19 DIAGNOSIS — J4520 Mild intermittent asthma, uncomplicated: Secondary | ICD-10-CM | POA: Diagnosis not present

## 2024-05-19 DIAGNOSIS — I10 Essential (primary) hypertension: Secondary | ICD-10-CM | POA: Diagnosis not present

## 2024-05-19 DIAGNOSIS — M25552 Pain in left hip: Secondary | ICD-10-CM | POA: Diagnosis not present

## 2024-06-15 NOTE — Progress Notes (Signed)
 This encounter was created in error - please disregard.

## 2024-08-12 DIAGNOSIS — Z96652 Presence of left artificial knee joint: Secondary | ICD-10-CM | POA: Diagnosis not present

## 2024-08-12 DIAGNOSIS — M1612 Unilateral primary osteoarthritis, left hip: Secondary | ICD-10-CM | POA: Diagnosis not present

## 2024-08-12 DIAGNOSIS — M25552 Pain in left hip: Secondary | ICD-10-CM | POA: Diagnosis not present

## 2024-09-10 ENCOUNTER — Other Ambulatory Visit (HOSPITAL_COMMUNITY): Payer: Self-pay

## 2024-09-10 MED ORDER — TRAMADOL HCL 50 MG PO TABS
50.0000 mg | ORAL_TABLET | Freq: Two times a day (BID) | ORAL | 0 refills | Status: AC | PRN
  Filled 2024-09-10: qty 30, 15d supply, fill #0

## 2024-10-26 ENCOUNTER — Other Ambulatory Visit (HOSPITAL_COMMUNITY): Payer: Self-pay

## 2024-11-01 ENCOUNTER — Encounter (HOSPITAL_COMMUNITY): Admission: RE | Admit: 2024-11-01

## 2024-11-09 ENCOUNTER — Ambulatory Visit (HOSPITAL_COMMUNITY): Admit: 2024-11-09 | Admitting: Orthopedic Surgery

## 2024-11-09 SURGERY — ARTHROPLASTY, HIP, TOTAL, ANTERIOR APPROACH
Anesthesia: Choice | Site: Hip | Laterality: Left
